# Patient Record
Sex: Female | Born: 1965
Health system: Southern US, Community
[De-identification: ages and names within clinical notes are randomized; demographics above are authoritative.]

## PROBLEM LIST (undated history)

## (undated) DIAGNOSIS — F419 Anxiety disorder, unspecified: Secondary | ICD-10-CM

## (undated) DIAGNOSIS — Z860101 Personal history of adenomatous and serrated colon polyps: Secondary | ICD-10-CM

## (undated) DIAGNOSIS — G4730 Sleep apnea, unspecified: Secondary | ICD-10-CM

## (undated) DIAGNOSIS — F32A Depression, unspecified: Secondary | ICD-10-CM

## (undated) DIAGNOSIS — E890 Postprocedural hypothyroidism: Secondary | ICD-10-CM

## (undated) DIAGNOSIS — Z85850 Personal history of malignant neoplasm of thyroid: Secondary | ICD-10-CM

## (undated) DIAGNOSIS — Z8679 Personal history of other diseases of the circulatory system: Secondary | ICD-10-CM

## (undated) DIAGNOSIS — F329 Major depressive disorder, single episode, unspecified: Secondary | ICD-10-CM

## (undated) DIAGNOSIS — C801 Malignant (primary) neoplasm, unspecified: Secondary | ICD-10-CM

## (undated) DIAGNOSIS — Z8742 Personal history of other diseases of the female genital tract: Secondary | ICD-10-CM

## (undated) DIAGNOSIS — Z8659 Personal history of other mental and behavioral disorders: Secondary | ICD-10-CM

## (undated) DIAGNOSIS — Z86010 Personal history of colonic polyps: Secondary | ICD-10-CM

## (undated) DIAGNOSIS — Z923 Personal history of irradiation: Secondary | ICD-10-CM

## (undated) HISTORY — PX: COLONOSCOPY: SHX174

## (undated) HISTORY — DX: Sleep apnea, unspecified: G47.30

## (undated) HISTORY — DX: Major depressive disorder, single episode, unspecified: F32.9

## (undated) HISTORY — DX: Depression, unspecified: F32.A

## (undated) HISTORY — PX: KNEE ARTHROSCOPY W/ ACL RECONSTRUCTION: SHX1858

## (undated) HISTORY — PX: LAPAROSCOPIC SALPINGOOPHERECTOMY: SUR795

## (undated) HISTORY — PX: POLYPECTOMY: SHX149

## (undated) HISTORY — PX: LIPOMA EXCISION: SHX5283

## (undated) HISTORY — DX: Postprocedural hypothyroidism: E89.0

## (undated) HISTORY — DX: Malignant (primary) neoplasm, unspecified: C80.1

## (undated) HISTORY — PX: OTHER SURGICAL HISTORY: SHX169

---

## 1993-04-26 DIAGNOSIS — E890 Postprocedural hypothyroidism: Secondary | ICD-10-CM

## 1993-04-26 DIAGNOSIS — Z923 Personal history of irradiation: Secondary | ICD-10-CM

## 1993-04-26 DIAGNOSIS — Z9889 Other specified postprocedural states: Secondary | ICD-10-CM

## 1993-04-26 HISTORY — PX: TOTAL THYROIDECTOMY: SHX2547

## 1993-04-26 HISTORY — DX: Postprocedural hypothyroidism: E89.0

## 1993-04-26 HISTORY — DX: Other specified postprocedural states: Z98.890

## 1993-04-26 HISTORY — DX: Personal history of irradiation: Z92.3

## 1999-02-03 ENCOUNTER — Other Ambulatory Visit: Admission: RE | Admit: 1999-02-03 | Discharge: 1999-02-03 | Payer: Self-pay | Admitting: *Deleted

## 2002-04-26 HISTORY — PX: APPENDECTOMY: SHX54

## 2002-09-04 ENCOUNTER — Other Ambulatory Visit: Admission: RE | Admit: 2002-09-04 | Discharge: 2002-09-04 | Payer: Self-pay | Admitting: Obstetrics and Gynecology

## 2004-10-13 ENCOUNTER — Ambulatory Visit (HOSPITAL_COMMUNITY): Admission: RE | Admit: 2004-10-13 | Discharge: 2004-10-13 | Payer: Self-pay | Admitting: General Surgery

## 2004-10-13 ENCOUNTER — Ambulatory Visit (HOSPITAL_BASED_OUTPATIENT_CLINIC_OR_DEPARTMENT_OTHER): Admission: RE | Admit: 2004-10-13 | Discharge: 2004-10-13 | Payer: Self-pay | Admitting: General Surgery

## 2004-10-13 ENCOUNTER — Encounter (INDEPENDENT_AMBULATORY_CARE_PROVIDER_SITE_OTHER): Payer: Self-pay | Admitting: *Deleted

## 2008-09-30 ENCOUNTER — Encounter: Payer: Self-pay | Admitting: Internal Medicine

## 2008-10-30 ENCOUNTER — Encounter: Payer: Self-pay | Admitting: Internal Medicine

## 2008-10-31 ENCOUNTER — Encounter: Payer: Self-pay | Admitting: Internal Medicine

## 2008-10-31 LAB — CONVERTED CEMR LAB
ALT: 17 units/L
BUN: 5 mg/dL
Chloride: 105 meq/L
Cholesterol: 145 mg/dL
HDL: 43 mg/dL
LDL Cholesterol: 85 mg/dL
Potassium: 4.1 meq/L
Sodium: 138 meq/L
Total Protein: 6.8 g/dL

## 2008-11-29 ENCOUNTER — Encounter: Payer: Self-pay | Admitting: Internal Medicine

## 2008-12-13 ENCOUNTER — Encounter: Payer: Self-pay | Admitting: Internal Medicine

## 2008-12-14 ENCOUNTER — Encounter: Payer: Self-pay | Admitting: Internal Medicine

## 2008-12-14 LAB — CONVERTED CEMR LAB: TSH: 1.43 microintl units/mL

## 2009-01-10 ENCOUNTER — Encounter: Payer: Self-pay | Admitting: Internal Medicine

## 2009-02-10 ENCOUNTER — Encounter: Payer: Self-pay | Admitting: Internal Medicine

## 2009-03-12 ENCOUNTER — Encounter: Payer: Self-pay | Admitting: Internal Medicine

## 2009-04-15 ENCOUNTER — Encounter: Payer: Self-pay | Admitting: Internal Medicine

## 2009-05-14 ENCOUNTER — Encounter: Payer: Self-pay | Admitting: Internal Medicine

## 2009-12-15 ENCOUNTER — Ambulatory Visit: Payer: Self-pay | Admitting: Internal Medicine

## 2009-12-15 DIAGNOSIS — F329 Major depressive disorder, single episode, unspecified: Secondary | ICD-10-CM

## 2009-12-15 DIAGNOSIS — R519 Headache, unspecified: Secondary | ICD-10-CM | POA: Insufficient documentation

## 2009-12-15 DIAGNOSIS — R309 Painful micturition, unspecified: Secondary | ICD-10-CM | POA: Insufficient documentation

## 2009-12-15 DIAGNOSIS — R300 Dysuria: Secondary | ICD-10-CM

## 2009-12-15 DIAGNOSIS — Z8679 Personal history of other diseases of the circulatory system: Secondary | ICD-10-CM | POA: Insufficient documentation

## 2009-12-15 DIAGNOSIS — R51 Headache: Secondary | ICD-10-CM | POA: Insufficient documentation

## 2009-12-15 DIAGNOSIS — F32A Depression, unspecified: Secondary | ICD-10-CM | POA: Insufficient documentation

## 2009-12-15 DIAGNOSIS — R32 Unspecified urinary incontinence: Secondary | ICD-10-CM | POA: Insufficient documentation

## 2009-12-15 DIAGNOSIS — E039 Hypothyroidism, unspecified: Secondary | ICD-10-CM | POA: Insufficient documentation

## 2009-12-15 DIAGNOSIS — Z9189 Other specified personal risk factors, not elsewhere classified: Secondary | ICD-10-CM | POA: Insufficient documentation

## 2009-12-15 LAB — CONVERTED CEMR LAB
Nitrite: POSITIVE
Specific Gravity, Urine: 1.015 (ref 1.000–1.030)
TSH: 12.73 microintl units/mL — ABNORMAL HIGH (ref 0.35–5.50)
Total Protein, Urine: 30 mg/dL
Urine Glucose: 100 mg/dL
Urobilinogen, UA: 4 (ref 0.0–1.0)

## 2009-12-16 ENCOUNTER — Encounter: Payer: Self-pay | Admitting: Internal Medicine

## 2009-12-23 ENCOUNTER — Telehealth (INDEPENDENT_AMBULATORY_CARE_PROVIDER_SITE_OTHER): Payer: Self-pay | Admitting: *Deleted

## 2009-12-24 ENCOUNTER — Encounter: Payer: Self-pay | Admitting: Internal Medicine

## 2010-03-23 ENCOUNTER — Ambulatory Visit: Payer: Self-pay | Admitting: Internal Medicine

## 2010-04-21 ENCOUNTER — Ambulatory Visit: Payer: Self-pay | Admitting: Internal Medicine

## 2010-05-28 NOTE — Letter (Signed)
Summary: [facility]   Imported By: Sherian Rein 01/06/2010 11:37:45  _____________________________________________________________________  External Attachment:    Type:   Image     Comment:   External Document

## 2010-05-28 NOTE — Progress Notes (Signed)
  Phone Note Other Incoming   Request: Send information Summary of Call: Records received from Dr. Rosalene Billings. 20 pages forwarded to Dr. Felicity Coyer for review.

## 2010-05-28 NOTE — Letter (Signed)
Summary: [facility]   Imported By: Sherian Rein 01/06/2010 11:39:23  _____________________________________________________________________  External Attachment:    Type:   Image     Comment:   External Document

## 2010-05-28 NOTE — Letter (Signed)
Summary: [facility]   Imported By: Sherian Rein 01/06/2010 11:36:56  _____________________________________________________________________  External Attachment:    Type:   Image     Comment:   External Document

## 2010-05-28 NOTE — Letter (Signed)
Summary: [facility]   Imported By: Sherian Rein 01/06/2010 11:38:28  _____________________________________________________________________  External Attachment:    Type:   Image     Comment:   External Document

## 2010-05-28 NOTE — Letter (Signed)
Summary: [facility]   Imported By: Sherian Rein 01/06/2010 11:34:15  _____________________________________________________________________  External Attachment:    Type:   Image     Comment:   External Document

## 2010-05-28 NOTE — Letter (Signed)
Summary: [facility]   Imported By: Sherian Rein 01/06/2010 11:41:00  _____________________________________________________________________  External Attachment:    Type:   Image     Comment:   External Document

## 2010-05-28 NOTE — Letter (Signed)
Summary: [facility]   Imported By: Sherian Rein 01/06/2010 11:35:58  _____________________________________________________________________  External Attachment:    Type:   Image     Comment:   External Document

## 2010-05-28 NOTE — Letter (Signed)
Summary: [facility]   Imported By: Sherian Rein 01/06/2010 11:40:08  _____________________________________________________________________  External Attachment:    Type:   Image     Comment:   External Document

## 2010-05-28 NOTE — Assessment & Plan Note (Signed)
Summary: NEW [employer] PT--PKG--#---STC   Vital Signs:  Patient profile:   45 year old female Height:      62 inches (157.48 cm) Weight:      217.0 pounds (98.64 kg) BMI:     39.83 O2 Sat:      97 % on Room air Temp:     98.4 degrees F (36.89 degrees C) oral Pulse rate:   75 / minute BP sitting:   118 / 82  (left arm) Cuff size:   large  Vitals Entered By: Orlan Leavens RMA (December 15, 2009 2:06 PM)  O2 Flow:  Room air CC: New patient Is Patient Diabetic? No Pain Assessment Patient in pain? no        Primary Care Provider:  Newt Lukes MD  CC:  New patient.  History of Present Illness: new pt to me and our practice, here to est care  1) hypothyroid, postsurgical s/p total thyroidectomy 1995 due to thyroid cancer - reports compliance with ongoing medical treatment and no changes in medication dose or frequency. denies adverse side effects related to current therapy.  feels she has gained sig weight since2 years ago when synth dose dropped from 150 to 125  2) c/o dysuria - hx same - has 3-4 UTI/yr - onset current symptoms 24-48h ago c/o ur freq and dicomfort, incomplete bladder empty sensation - no hematuria or flanks pain - mild suprapubic discomfort - symptoms improved since starting OTC azo  3) obesity - fustrated with inability to lose weight has tried diet and exercise: Advertising account planner, weight watchers - prev temp success with adipex but resumed weight gain as soon as med stopped- recognizes part of issue is her inability to control her eating binges - "eat until i am sick"  Preventive Screening-Counseling & Management  Alcohol-Tobacco     Alcohol drinks/day: <1     Alcohol Counseling: not indicated; use of alcohol is not excessive or problematic     Smoking Status: never     Tobacco Counseling: not indicated; no tobacco use  Caffeine-Diet-Exercise     Diet Counseling: to improve diet; diet is suboptimal     Does Patient Exercise: no  Exercise Counseling: to improve exercise regimen     Depression Counseling: not indicated; screening negative for depression  Safety-Violence-Falls     Seat Belt Counseling: not indicated; patient wears seat belts     Helmet Counseling: not indicated; patient wears helmet when riding bicycle/motocycle     Firearms in the Home: no firearms in the home     Firearm Counseling: not applicable     Violence Counseling: not indicated; no violence risk noted     Fall Risk Counseling: not indicated; no significant falls noted  Current Medications (verified): 1)  Levothyroxine Sodium 125 Mcg Tabs (Levothyroxine Sodium) .... Take 1 By Mouth Once Daily  Allergies (verified): No Known Drug Allergies  Past History:  Past Medical History: Depression Hypothyroidism, post surgical - hx thyroid cancer s/p resction and XRT -1994 obesity  [place]  Past Surgical History: Appendectomy (2004) Caesarean section (1986) Thyroidectomy, total (1995)   Family History: Family History of Colon CA 1st degree relative <60 (parent) Alzheimers (grandparent,parent,other relative)  Social History: Never Smoked, occassional alcohol use married, lives with spouse  works as Airline pilot Smoking Status:  never Does Patient Exercise:  no  Review of Systems       see HPI above. I have reviewed all other systems  and they were negative.   Physical Exam  General:  overweight-appearing.  alert, well-developed, well-nourished, and cooperative to examination.    Head:  Normocephalic and atraumatic without obvious abnormalities. No apparent alopecia or balding. Eyes:  vision grossly intact; pupils equal, round and reactive to light.  conjunctiva and lids normal.    Ears:  normal pinnae bilaterally, without erythema, swelling, or tenderness to palpation. TMs clear, without effusion, or cerumen impaction. Hearing grossly normal bilaterally  Mouth:  teeth and gums in  good repair; mucous membranes moist, without lesions or ulcers. oropharynx clear without exudate, no erythema.  Neck:  well healed thyroidectomy scar; supple, full ROM, no masses, no JVD or carotid bruits.   Lungs:  normal respiratory effort, no intercostal retractions or use of accessory muscles; normal breath sounds bilaterally - no crackles and no wheezes.    Heart:  normal rate, regular rhythm, no murmur, and no rub. BLE without edema. Abdomen:  obese, soft, non-tender, normal bowel sounds, no distention; no masses and no appreciable hepatomegaly or splenomegaly.   Genitalia:  defer to gyn Msk:  No deformity or scoliosis noted of thoracic or lumbar spine.   Neurologic:  alert & oriented X3 and cranial nerves II-XII symetrically intact.  strength normal in all extremities, sensation intact to light touch, and gait normal. speech fluent without dysarthria or aphasia; follows commands with good comprehension.  Skin:  very dry scaling skin of B feet>fingers/hands -  Psych:  Oriented X3, memory intact for recent and remote, normally interactive, good eye contact, not anxious appearing, not depressed appearing, and not agitated.      Impression & Recommendations:  Problem # 1:  HYPOTHYROIDISM (ICD-244.9) hx thyroid cancer 1994 - s/p thyroidectomy and XRT for same review prior records and check TSH now- consider resumed daily dose given weight and skin issues if TSH not over suppressed at current dose  Her updated medication list for this problem includes:    Levothyroxine Sodium 125 Mcg Tabs (Levothyroxine sodium) .Marland Kitchen... Take 1 by mouth once daily  Orders: TLB-TSH (Thyroid Stimulating Hormone) (84443-TSH)  Problem # 2:  DYSURIA (ICD-788.1) improved with azo - check labs and Ucx now - tx abx if +infx identified - Orders: TLB-Udip w/ Micro (81001-URINE) T-Culture, Urine ([id number])  Encouraged to push clear liquids, get enough rest, and take acetaminophen as needed. To be seen in  10 days if no improvement, sooner if worse.  Problem # 3:  OBESITY (ICD-278.00) discussed prior efforts at weight loss - none sustained - recognizes behavioral component as well (binge eating; lack of self control) checklabs r/o DM and cho issues at future visits - also discussed poss surg options that may or may not be of help to pt's particular situation -  encouraged to attend a bariatric seminar presentation if interested otherwise, consider behav health along with cont exercise efforts  Ht: 62 (12/15/2009)   Wt: 217.0 (12/15/2009)   BMI: 39.83 (12/15/2009)  Problem # 4:  UNSPECIFIED URINARY INCONTINENCE (ICD-788.30) c/o constant mild incontinence of urine - not stress or urge by hx - refer to gyn for pelvic/PAP and urogyn tx as needed -  Orders: Gynecologic Referral (Gyn)  Complete Medication List: 1)  Levothyroxine Sodium 125 Mcg Tabs (Levothyroxine sodium) .... Take 1 by mouth once daily  Patient Instructions: 1)  it was good to see you today. 2)  test(s) ordered today - your results will be posted on the phone tree for review in 48-72 hours from the time of test  completion; call [phone] and enter your 9 digit MRN (listed above on this page, just below your name); if any changes need to be made or there are abnormal results (need for antibioitcs or med dose change), you will be contacted directly. 3)  we'll make referral to gynecology. Our office will contact you regarding this appointment once made.  4)  [facility]  Please schedule a follow-up appointment in 3months, sooner if problems.

## 2010-05-28 NOTE — Letter (Signed)
Summary: [facility]   Imported By: Sherian Rein 01/06/2010 11:35:08  _____________________________________________________________________  External Attachment:    Type:   Image     Comment:   External Document

## 2010-05-28 NOTE — Assessment & Plan Note (Signed)
Summary: 3 MTH FU  STC   Vital Signs:  Patient profile:   45 year old female Height:      62 inches (157.48 cm) Weight:      214.4 pounds (97.45 kg) O2 Sat:      98 % on Room air Temp:     97.9 degrees F (36.61 degrees C) oral Pulse rate:   74 / minute BP sitting:   112 / 72  (right arm) Cuff size:   large  Vitals Entered By: Orlan Leavens RMA (March 23, 2010 1:19 PM)  O2 Flow:  Room air CC: 3 month follow-up Is Patient Diabetic? No Pain Assessment Patient in pain? no        Primary Care Provider:  Newt Lukes MD  CC:  3 month follow-up.  History of Present Illness: here for f/u  hypothyroid, postsurgical s/p total thyroidectomy 1995 due to thyroid cancer - reports compliance with ongoing medical treatment; last change in medication dose or frequency here 11/2009 due to abn TSH. denies adverse side effects related to current therapy.  feels she has gained sig weight since 2 years ago when synth dose dropped from 150 to 125   obesity - fustrated with inability to lose weight has tried diet and exercise: Advertising account planner, weight watchers - prev temp success with adipex but resumed weight gain as soon as med stopped- recognizes part of issue is her inability to control her eating binges - "eat until i am sick"  Clinical Review Panels:  Prevention   Last Pap Smear:  Interpretation Result:Negative for intraepithelial Lesion or Malignancy.    (10/30/2008)  Lipid Management   Cholesterol:  145 (10/31/2008)   LDL (bad choesterol):  85 (10/31/2008)   HDL (good cholesterol):  43 (10/31/2008)   Triglycerides:  76 (10/31/2008)   Current Medications (verified): 1)  Levothyroxine Sodium 150 Mcg Tabs (Levothyroxine Sodium) .Marland Kitchen.. 1 By Mouth Once Daily  Allergies (verified): No Known Drug Allergies  Past History:  Past Medical History: Depression Hypothyroidism, post surgical hx thyroid cancer s/p resction and XRT -1994 obesity  [place]  Review of Systems  The patient denies anorexia, weight gain, chest pain, headaches, and abdominal pain.    Physical Exam  General:  overweight-appearing.  alert, well-developed, well-nourished, and cooperative to examination.    Neck:  well healed thyroidectomy scar; supple, full ROM, no masses, no JVD or carotid bruits.   Lungs:  normal respiratory effort, no intercostal retractions or use of accessory muscles; normal breath sounds bilaterally - no crackles and no wheezes.    Heart:  normal rate, regular rhythm, no murmur, and no rub. BLE without edema.   Impression & Recommendations:  Problem # 1:  HYPOTHYROIDISM (ICD-244.9)  Her updated medication list for this problem includes:    Levothyroxine Sodium 150 Mcg Tabs (Levothyroxine sodium) .Marland Kitchen... 1 by mouth once daily  hx thyroid cancer 1994 - s/p thyroidectomy and XRT for same resumed daily dose 11/2009 due to abnTSH and weight/ skin issues  Orders: TLB-TSH (Thyroid Stimulating Hormone) (84443-TSH)  Labs Reviewed: TSH: 12.73 (12/15/2009)    Chol: 145 (10/31/2008)   HDL: 43 (10/31/2008)   LDL: 85 (10/31/2008)   TG: 76 (10/31/2008)  Problem # 2:  OBESITY (ICD-278.00)  discussed prior efforts at weight loss - none sustained - recognizes behavioral component as well (binge eating; lack of self control) also discussed poss surg options that may or may not be of  help to pt's particular situation -  has attended a bariatric seminar 12/2009 - needs 6 mo MD supervised weight loss program to be approved for surg by insurance will f/u here 30d for same - consider adipex (used same prev) otherwise, consider behav health along with cont exercise efforts at gym as ongoing watch calorie consumption - goal 1200mg /d, reviewed dietary food goals  Ht: 62 (12/15/2009)   Wt: 217.0 (12/15/2009)   BMI: 39.83 (12/15/2009)  Ht: 62 (03/23/2010)   Wt: 214.4 (03/23/2010)   BMI: 39.83  (12/15/2009)  Complete Medication List: 1)  Levothyroxine Sodium 150 Mcg Tabs (Levothyroxine sodium) .Marland Kitchen.. 1 by mouth once daily  Patient Instructions: 1)  it was good to see you today. 2)  test(s) ordered today - your results will be posted on the phone tree for review in 48-72 hours from the time of test completion; call [phone] and enter your 9 digit MRN (listed above on this page, just below your name); if any changes need to be made or there are abnormal results, you will be contacted directly. 3)  Please schedule a follow-up appointment in 1 month to review weight and diet efforts, call sooner if problems.  (may cancel if bariatric clinic has other monitoring for your 34month supervised weight loss requirement) 4)  continue exercsie at gym - 74min/session 3-4x/week - aerobic and add in weight training as able 5)  limit calories to 1200mg /day - brown carbs like whole wheat pastas or brown rice are preferred for better "glycemic index" - look for ratio <20:1 for carb:fiber on label to guide these choices   Orders Added: 1)  TLB-TSH (Thyroid Stimulating Hormone) [84443-TSH] 2)  Est. Patient Level IV [86578]

## 2010-08-31 ENCOUNTER — Encounter: Payer: Self-pay | Admitting: Internal Medicine

## 2010-08-31 ENCOUNTER — Other Ambulatory Visit: Payer: 59

## 2010-08-31 ENCOUNTER — Ambulatory Visit (INDEPENDENT_AMBULATORY_CARE_PROVIDER_SITE_OTHER): Payer: 59 | Admitting: Internal Medicine

## 2010-08-31 VITALS — BP 102/72 | HR 75 | Temp 98.4°F

## 2010-08-31 DIAGNOSIS — N390 Urinary tract infection, site not specified: Secondary | ICD-10-CM

## 2010-08-31 LAB — POCT URINALYSIS DIPSTICK
Nitrite, UA: NEGATIVE
Protein, UA: NEGATIVE
Spec Grav, UA: 1.002
Urobilinogen, UA: 0.2

## 2010-08-31 MED ORDER — SULFAMETHOXAZOLE-TRIMETHOPRIM 800-160 MG PO TABS: 1.0000 | ORAL_TABLET | Freq: Two times a day (BID) | ORAL | Status: AC

## 2010-08-31 MED ORDER — LEVOTHYROXINE SODIUM 125 MCG PO CAPS: 125.0000 ug | ORAL_CAPSULE | ORAL | Status: DC

## 2010-08-31 NOTE — Progress Notes (Signed)
  Subjective:    Patient ID: [name], female    DOB: 01-13-1966, 45 y.o.   MRN: [id number]  HPI   here for UTI symptoms Onset 3-4 weeks ago Hx recurrent UTI dating back to young childhood associated with with urinary incont - prior eval by gyn 2011- no prolapse or other gyn problem identified During abx tx for prior UTI, urinary incont improved temporarily - ?relation +flank discomfort and small volume frequent voiding last 1 week - No fever, no hematuria  Also reviewed chronic medical issues: hypothyroid, postsurgical s/p total thyroidectomy 1995 due to thyroid cancer - reports compliance with ongoing medical treatment; last change in medication dose or frequency here 11/2009 due to abn TSH. denies adverse side effects related to current therapy.  feels she has gained sig weight since 2 years ago when synth dose dropped from 150 to 125  obesity - fustrated with inability to lose weight has tried diet and exercise: Advertising account planner, weight watchers - prev temp success with adipex but resumed weight gain as soon as med stopped- recognizes part of issue is her inability to control her eating binges - "eat until i am sick"  Past Medical History  Diagnosis Date  . Thyroid cancer 1994    resection and xrt  . Hypothyroidism, postsurgical 1994  . Depression      Review of Systems  Respiratory: Negative for cough.   Cardiovascular: Negative for chest pain.  Genitourinary: Positive for dysuria, urgency and frequency. Negative for hematuria, flank pain and difficulty urinating.       Objective:   Physical Exam BP 102/72  Pulse 75  Temp(Src) 98.4 F (36.9 C) (Oral)  SpO2 98% Physical Exam  Constitutional: She is overweight; oriented to person, place, and time. She appears well-developed and well-nourished. No distress.  Cardiovascular: Normal rate, regular rhythm and normal heart sounds.  No murmur heard. Pulmonary/Chest: Effort normal and breath sounds normal.  No respiratory distress. She has no wheezes.  Abdominal: Soft. Bowel sounds are normal. She exhibits no distension. There is suprapubic tenderness. No flank tenderness to palpation Psychiatric: She has a normal mood and affect. Her behavior is normal. Judgment and thought content normal.   Lab Results  Component Value Date   CHOL 145 10/31/2008   HDL 43 10/31/2008   ALT 17 10/31/2008   AST 23 10/31/2008   NA 138 10/31/2008   K 4.1 10/31/2008   CL 105 10/31/2008   CREATININE 0.6 10/31/2008   BUN 5 10/31/2008   CO2 27 10/31/2008   TSH 0.17* 03/23/2010        Assessment & Plan:  UTI, recurrent with child hx same - ?urinay reflux per pt recollection - never with adult uro eval Udip unremarkable but classic symptoms - will send for Ucx and tx with emperic septra (recent tx with cipro at [facility] for same) Also refer to uro

## 2010-08-31 NOTE — Patient Instructions (Signed)
It was good to see you today. Septra antibiotics for bladder symptoms like UTI - Your prescription(s) have been submitted to your pharmacy. Please take as directed and contact our office if you believe you are having problem(s) with the medication(s). we'll make referral to urology given recurrent history of frequent bladder infections since childhood. Our office will contact you regarding appointment(s) once made.

## 2010-09-03 LAB — URINE CULTURE

## 2010-09-04 ENCOUNTER — Telehealth: Payer: Self-pay | Admitting: *Deleted

## 2010-09-04 MED ORDER — NITROFURANTOIN MONOHYD MACRO 100 MG PO CAPS: 100.0000 mg | ORAL_CAPSULE | Freq: Two times a day (BID) | ORAL | Status: DC

## 2010-09-04 NOTE — Telephone Encounter (Signed)
Sent to [facility] in [place].Marland KitchenMarland Kitchen5/11/12@10 :35am/LMB

## 2010-09-11 NOTE — Op Note (Signed)
NAMEJoelie, [name]              ACCOUNT NO.:  [id number]   MEDICAL RECORD NO.:  [id number]          PATIENT TYPE:  AMB   LOCATION:  [facility]                         FACILITY:  [facility]   PHYSICIAN:  Sharlet Salina T. [place], M.D.DATE OF BIRTH:  12/05/1965   DATE OF PROCEDURE:  10/13/2004  DATE OF DISCHARGE:                                 OPERATIVE REPORT   PREOPERATIVE DIAGNOSIS:  Subcutaneous mass, right neck, probable lipoma.   POSTOPERATIVE DIAGNOSIS:  Subcutaneous mass right neck, probable lipoma.   SURGICAL PROCEDURES:  Excision subcutaneous mass right neck.   SURGEON:  Dr. Johna Sheriff   ANESTHESIA:  Local IV sedation.   BRIEF HISTORY:  [name] is a 45 year old female, who has a history of  about 13 years ago of excision of a lipoma from her right posterior neck.  She recently has noted a recurrent mass adjacent to the incision and on  examination, has approximately a 3-4 cm fleshy subcutaneous mass palpable  just lateral to the old excision site, consistent with a recurrent lipoma.  Since this is an enlarging and symptomatic, we have recommended to  proceeding  with excision.  The nature of the procedure, indications, risks  of bleeding, infection, nerve injury discussed and understood.  She is now  brought to the operating room for this procedure.   DESCRIPTION OF OPERATION:  The patient brought to the operating room, placed  in the left lateral decubitus position, and IV sedation was administered.  The right posterior neck was sterilely prepped and draped.  I used the  lateral aspect of the previous transverse incision and extended this  somewhat more laterally, and dissection was carried down through the  subcutaneous tissue.  A very discrete, well-circumscribed, fatty mass was  encountered in the deep subcutaneous tissue, and this was completely  dissected free intact from surrounding tissue and from the deeper tissue.  It did not extend down to the level of the fascia or  muscle.  It was removed  intact.  Hemostasis was obtained with cautery.  The skin was closed with  running subcuticular 4-0 Monocryl and Steri-Strips.  Sponge, needle, and  instrument counts were correct.  Dry sterile dressing was applied and  patient taken recovery in good condition.       BTH/MEDQ  D:  10/13/2004  T:  10/13/2004  Job:  [id number]

## 2010-11-27 ENCOUNTER — Encounter: Payer: Self-pay | Admitting: Internal Medicine

## 2010-12-02 ENCOUNTER — Encounter: Payer: Self-pay | Admitting: Internal Medicine

## 2010-12-02 ENCOUNTER — Other Ambulatory Visit (INDEPENDENT_AMBULATORY_CARE_PROVIDER_SITE_OTHER): Payer: 59

## 2010-12-02 ENCOUNTER — Ambulatory Visit (INDEPENDENT_AMBULATORY_CARE_PROVIDER_SITE_OTHER): Payer: 59 | Admitting: Internal Medicine

## 2010-12-02 VITALS — BP 104/72 | HR 65 | Temp 97.1°F | Ht 62.0 in | Wt 219.0 lb

## 2010-12-02 DIAGNOSIS — R197 Diarrhea, unspecified: Secondary | ICD-10-CM

## 2010-12-02 DIAGNOSIS — Z0000 Encounter for general adult medical examination without abnormal findings: Secondary | ICD-10-CM

## 2010-12-02 DIAGNOSIS — Z23 Encounter for immunization: Secondary | ICD-10-CM

## 2010-12-02 DIAGNOSIS — E039 Hypothyroidism, unspecified: Secondary | ICD-10-CM

## 2010-12-02 DIAGNOSIS — Z1211 Encounter for screening for malignant neoplasm of colon: Secondary | ICD-10-CM

## 2010-12-02 LAB — URINALYSIS
Bilirubin Urine: NEGATIVE
Hgb urine dipstick: NEGATIVE
Ketones, ur: 40
Leukocytes, UA: NEGATIVE
Nitrite: NEGATIVE

## 2010-12-02 LAB — BASIC METABOLIC PANEL
Chloride: 106 mEq/L (ref 96–112)
Potassium: 3.9 mEq/L (ref 3.5–5.1)

## 2010-12-02 LAB — HEPATIC FUNCTION PANEL
ALT: 19 U/L (ref 0–35)
AST: 18 U/L (ref 0–37)
Alkaline Phosphatase: 69 U/L (ref 39–117)
Bilirubin, Direct: 0.1 mg/dL (ref 0.0–0.3)
Total Bilirubin: 0.7 mg/dL (ref 0.3–1.2)

## 2010-12-02 LAB — TSH: TSH: 0.55 u[IU]/mL (ref 0.35–5.50)

## 2010-12-02 LAB — CBC WITH DIFFERENTIAL/PLATELET
Basophils Absolute: 0 10*3/uL (ref 0.0–0.1)
Eosinophils Absolute: 0.2 10*3/uL (ref 0.0–0.7)
Lymphocytes Relative: 22.4 % (ref 12.0–46.0)
MCHC: 33.5 g/dL (ref 30.0–36.0)
Neutrophils Relative %: 69.1 % (ref 43.0–77.0)
RBC: 4.48 Mil/uL (ref 3.87–5.11)
RDW: 14.5 % (ref 11.5–14.6)

## 2010-12-02 LAB — LIPID PANEL
HDL: 55.7 mg/dL (ref >39.00)
LDL Cholesterol: 86 mg/dL (ref 0–99)
Total CHOL/HDL Ratio: 3
VLDL: 16.8 mg/dL (ref 0.0–40.0)

## 2010-12-02 MED ORDER — VALACYCLOVIR HCL 1 G PO TABS: 1000.0000 mg | ORAL_TABLET | Freq: Two times a day (BID) | ORAL | Status: DC

## 2010-12-02 NOTE — Progress Notes (Signed)
Subjective:    Patient ID: [name], female    DOB: 1966/02/01, 45 y.o.   MRN: [id number]  HPI  patient is here today for annual physical. Patient feels well over -  complains of diarrhea x 3 months - daily "loose or liquid" stools BM 4-5x/day, small volume Not abd cramping or pain, no BRBPR or melena No weight loss, no fever, no nausea and vomiting  No travel, cruise, undercooked food or new meds - but ?if thyroid is off  also reviewed chronic medical issues:  hypothyroid, postsurgical s/p total thyroidectomy 1994 due to thyroid cancer - reports compliance with ongoing medical treatment; last change in medication dose or frequency 11/2009 due to abn TSH. denies adverse side effects related to current therapy.  feels she has gained sig weight since 2010 when synth dose dropped from 150 to 125  Depression - takes Lithium and prozac for same - mood stable, no SI/HI   Past Medical History  Diagnosis Date  . OBESITY   . EATING DISORDER, HX OF   . Thyroid cancer 1994    resection and xrt  . Hypothyroidism, postsurgical 1994  . Depression    Family History  Problem Relation Age of Onset  . Colon cancer Other   . Alzheimer's disease Other   . Alzheimer's disease Mother   . Cancer Father 21    colo cancer  . Alzheimer's disease Maternal Grandmother    History  Substance Use Topics  . Smoking status: Never Smoker   . Smokeless tobacco: Not on file   Comment: Married, lives with spouse. Works as Airline pilot  . Alcohol Use: Yes     Occassional   Review of Systems  Respiratory: Negative for cough.   Cardiovascular: Negative for chest pain.  Genitourinary: Positive for dysuria, urgency and frequency. Negative for hematuria, flank pain and difficulty urinating.  Constitutional: Negative for fever.  Gastrointestinal: Negative for abdominal pain.  Musculoskeletal: Negative for gait problem.  Skin: Negative for rash.  Neurological: Negative for dizziness.  No other  specific complaints in a complete review of systems (except as listed in HPI above).      Objective:   Physical Exam  BP 104/72  Pulse 65  Temp(Src) 97.1 F (36.2 C) (Oral)  Ht 5\' 2"  (1.575 m)  Wt 219 lb (99.338 kg)  BMI 40.06 kg/m2  SpO2 97%  LMP 11/25/2010 Wt Readings from Last 3 Encounters:  12/02/10 219 lb (99.338 kg)  03/23/10 214 lb 6.4 oz (97.251 kg)  12/15/09 217 lb (98.431 kg)   Constitutional: She is overweight; She appears well-developed and well-nourished. No distress. spouse at side HENT: NCAT, Ears with clear TMs bilaterally, no effusion; OP clear without erythema or exudate Eyes: PERRL, EOMI - no conjunctivitis or icterus Cardiovascular: Normal rate, regular rhythm and normal heart sounds.  No murmur heard. no BLE edema Pulmonary/Chest: Effort normal and breath sounds normal. No respiratory distress. She has no wheezes.  Abdominal: Soft. Bowel sounds are normal. no tenderness. No distention, no mass Neuro: AAOx4, CN2-12 symmetrically intact; MAE well, coordination and balance normal; speech and cognition normal Psychiatric: She has a normal mood and affect. Her behavior is normal. Judgment and thought content normal.  Skin, healing cold sore under L nares  Lab Results  Component Value Date   WBC 12.6* 12/02/2010   HGB 13.7 12/02/2010   HCT 40.8 12/02/2010   PLT 291.0 12/02/2010   CHOL 158 12/02/2010   TRIG 84.0 12/02/2010   HDL 55.70 12/02/2010  ALT 19 12/02/2010   AST 18 12/02/2010   NA 139 12/02/2010   K 3.9 12/02/2010   CL 106 12/02/2010   CREATININE 0.8 12/02/2010   BUN 14 12/02/2010   CO2 28 12/02/2010   TSH 0.55 12/02/2010        Assessment & Plan:  CPX - v70.0 - Patient has been counseled on age-appropriate routine health concerns for screening and prevention. These are reviewed and up-to-date. Immunizations are up-to-date or declined. Labs ordered and ECG declined. Colo screen refer now due to FH (dad colo ca age 27)  Diarrhea - no red flags on hx or exam - check labs  rule out thyroid problems - ?malabs - no change meds at this time

## 2010-12-02 NOTE — Patient Instructions (Signed)
It was good to see you today. We have reviewed your prior records including labs and tests today Medications reviewed, refill on Valtrex as requested Test(s) ordered today. Your results will be called to you after review (48-72hours after test completion). If any changes need to be made, you will be notified at that time. we'll make referral to GI for screening colonoscopy. Our office will contact you regarding appointment(s) once made. Please schedule followup in 3-6 months for thyroid check, call sooner if problems.

## 2010-12-02 NOTE — Assessment & Plan Note (Signed)
S/p totall thyroidectomy 1995 due to cancer Dose change 11/2009 due to abn TSH - Check tsh now - adjust as needed

## 2010-12-08 ENCOUNTER — Encounter: Payer: Self-pay | Admitting: Internal Medicine

## 2010-12-31 ENCOUNTER — Telehealth: Payer: Self-pay | Admitting: *Deleted

## 2010-12-31 ENCOUNTER — Ambulatory Visit (AMBULATORY_SURGERY_CENTER): Payer: 59 | Admitting: *Deleted

## 2010-12-31 VITALS — Ht 62.0 in | Wt 226.7 lb

## 2010-12-31 DIAGNOSIS — Z1211 Encounter for screening for malignant neoplasm of colon: Secondary | ICD-10-CM

## 2010-12-31 MED ORDER — SUPREP BOWEL PREP KIT 17.5-3.13-1.6 GM/177ML PO SOLN: 1.0000 | Freq: Once | ORAL | Status: DC

## 2010-12-31 NOTE — Telephone Encounter (Signed)
Dr. Juanda Chance- pt is 45 years old; scheduled for screening colonoscopy.  Father diagnosed with colon cancer at age 14.  While in OV pt says that she has been having watery diarrhea since April.  No blood and denies abdominal pain. No treatment for diarrhea.  Is she okay for direct screening colonoscopy or do you want to see her in office first? Thanks, Olegario Messier

## 2011-01-01 ENCOUNTER — Other Ambulatory Visit: Payer: Self-pay | Admitting: Internal Medicine

## 2011-01-01 NOTE — Telephone Encounter (Signed)
She can be a direct colon. I will talk to her before the procedure.

## 2011-01-14 ENCOUNTER — Ambulatory Visit (AMBULATORY_SURGERY_CENTER): Payer: 59 | Admitting: Internal Medicine

## 2011-01-14 ENCOUNTER — Encounter: Payer: Self-pay | Admitting: Internal Medicine

## 2011-01-14 VITALS — BP 107/64 | HR 66 | Temp 98.3°F | Resp 13 | Ht 62.4 in | Wt 226.0 lb

## 2011-01-14 DIAGNOSIS — D126 Benign neoplasm of colon, unspecified: Secondary | ICD-10-CM

## 2011-01-14 DIAGNOSIS — Z1211 Encounter for screening for malignant neoplasm of colon: Secondary | ICD-10-CM

## 2011-01-14 MED ORDER — SODIUM CHLORIDE 0.9 % IV SOLN: 500.0000 mL | INTRAVENOUS | Status: DC

## 2011-01-14 NOTE — Patient Instructions (Signed)
Read your handouts given to you by your recovery room nurse.    Your polyp results will be mailed to you within 2 weeks.   Resume your routine medications today.    If you have any questions or concerns, please call [phone].   Thank-you for choosing [facility] for your medical needs today.

## 2011-01-15 ENCOUNTER — Telehealth: Payer: Self-pay | Admitting: *Deleted

## 2011-01-15 NOTE — Telephone Encounter (Signed)
No ID on voice mail.   No message left. 

## 2011-01-19 ENCOUNTER — Encounter: Payer: Self-pay | Admitting: Internal Medicine

## 2011-06-07 ENCOUNTER — Other Ambulatory Visit: Payer: Self-pay | Admitting: Internal Medicine

## 2011-06-07 ENCOUNTER — Encounter: Payer: Self-pay | Admitting: Internal Medicine

## 2011-06-07 ENCOUNTER — Ambulatory Visit (INDEPENDENT_AMBULATORY_CARE_PROVIDER_SITE_OTHER): Payer: 59 | Admitting: Internal Medicine

## 2011-06-07 ENCOUNTER — Other Ambulatory Visit (INDEPENDENT_AMBULATORY_CARE_PROVIDER_SITE_OTHER): Payer: 59

## 2011-06-07 VITALS — BP 100/64 | HR 77 | Temp 97.6°F | Wt 236.8 lb

## 2011-06-07 DIAGNOSIS — F329 Major depressive disorder, single episode, unspecified: Secondary | ICD-10-CM

## 2011-06-07 DIAGNOSIS — F3289 Other specified depressive episodes: Secondary | ICD-10-CM

## 2011-06-07 DIAGNOSIS — E669 Obesity, unspecified: Secondary | ICD-10-CM

## 2011-06-07 DIAGNOSIS — E039 Hypothyroidism, unspecified: Secondary | ICD-10-CM

## 2011-06-07 MED ORDER — LEVOTHYROXINE SODIUM 150 MCG PO TABS: 150.0000 ug | ORAL_TABLET | Freq: Every day | ORAL | Status: DC

## 2011-06-07 MED ORDER — VENLAFAXINE HCL ER 37.5 MG PO CP24: 37.5000 mg | ORAL_CAPSULE | Freq: Every day | ORAL | Status: DC

## 2011-06-07 NOTE — Assessment & Plan Note (Signed)
S/p totall thyroidectomy 1995 due to cancer Dose change 11/2009 due to abn TSH - Check tsh now - adjust as needed Lab Results  Component Value Date   TSH 0.55 12/02/2010

## 2011-06-07 NOTE — Assessment & Plan Note (Signed)
Intolerant of prozac, Paxil, welllbutrin and Lithium - also prior cymbalta Start effexor now - we reviewed potential risk/benefit and possible side effects - pt understands and agrees to same

## 2011-06-07 NOTE — Progress Notes (Signed)
  Subjective:    Patient ID: [name], female    DOB: 1966-03-08, 46 y.o.   MRN: [id number]  HPI  Here for follow up - reviewed chronic medical issues:  hypothyroid, postsurgical s/p total thyroidectomy 1994 due to thyroid cancer - reports compliance with ongoing medical treatment; last change in medication dose or frequency 11/2009 due to abn TSH. denies adverse side effects related to current therapy.  feels she has gained sig weight since 2010   Depression - previously prescribed Lithium and prozac for same, but not taking due to ?side effects  - mood is down but no SI/HI - prev on wellbutrin, cymbalta, paxil  Obesity - reports "no energy" for exercise - requests a diet supplement prescription - reports has investigated bariatric surgical options and  Has been denied by her insurance for same   Past Medical History  Diagnosis Date  . OBESITY   . EATING DISORDER, HX OF   . Hypothyroidism, postsurgical 1994  . Depression   . Bipolar disorder   . Thyroid cancer 1995    resection and xrt    Review of Systems  Respiratory: Negative for cough.   Cardiovascular: Negative for chest pain.  Genitourinary: Negative for dysuria, hematuria, flank pain and difficulty urinating.  Psychiatric/Behavioral: Positive for dysphoric mood. Negative for confusion and decreased concentration.      Objective:   Physical Exam  BP 100/64  Pulse 77  Temp(Src) 97.6 F (36.4 C) (Oral)  Wt 236 lb 12.8 oz (107.412 kg)  SpO2 97% Wt Readings from Last 3 Encounters:  06/07/11 236 lb 12.8 oz (107.412 kg)  01/14/11 226 lb (102.513 kg)  12/31/10 226 lb 11.2 oz (102.83 kg)   Constitutional: She is overweight; but appears well-developed and well-nourished. No distress. Cardiovascular: Normal rate, regular rhythm and normal heart sounds.  No murmur heard. no BLE edema Pulmonary/Chest: Effort normal and breath sounds normal. No respiratory distress. She has no wheezes.  Psychiatric: She has a  dysthmic mood and affect. Her behavior is normal. Judgment and thought content normal.    Lab Results  Component Value Date   WBC 12.6* 12/02/2010   HGB 13.7 12/02/2010   HCT 40.8 12/02/2010   PLT 291.0 12/02/2010   CHOL 158 12/02/2010   TRIG 84.0 12/02/2010   HDL 55.70 12/02/2010   ALT 19 12/02/2010   AST 18 12/02/2010   NA 139 12/02/2010   K 3.9 12/02/2010   CL 106 12/02/2010   CREATININE 0.8 12/02/2010   BUN 14 12/02/2010   CO2 28 12/02/2010   TSH 0.55 12/02/2010       Assessment & Plan:   See problem list. Medications and labs reviewed today.

## 2011-06-07 NOTE — Assessment & Plan Note (Signed)
Wt Readings from Last 3 Encounters:  06/07/11 236 lb 12.8 oz (107.412 kg)  01/14/11 226 lb (102.513 kg)  12/31/10 226 lb 11.2 oz (102.83 kg)   Could consider appetite suppressant prescription but I feel it is important to control depression symptoms first Also encouraged patient to consider review of bariatric options

## 2011-06-07 NOTE — Patient Instructions (Signed)
It was good to see you today. Test(s) ordered today. Your results will be called to you after review (48-72hours after test completion). If any changes need to be made, you will be notified at that time. Start effexor (generic) low dose today for depression - Your prescription(s) have been submitted to your pharmacy. Please take as directed and contact our office if you believe you are having problem(s) with the medication(s). Please schedule followup in 6-12 weeks to review depression symptoms, medications and weight - call sooner if problems.

## 2011-07-16 ENCOUNTER — Ambulatory Visit (INDEPENDENT_AMBULATORY_CARE_PROVIDER_SITE_OTHER): Payer: 59 | Admitting: Internal Medicine

## 2011-07-16 ENCOUNTER — Encounter: Payer: Self-pay | Admitting: Internal Medicine

## 2011-07-16 ENCOUNTER — Other Ambulatory Visit (INDEPENDENT_AMBULATORY_CARE_PROVIDER_SITE_OTHER): Payer: 59

## 2011-07-16 VITALS — BP 116/82 | HR 88 | Temp 97.8°F | Resp 18 | Wt 235.0 lb

## 2011-07-16 DIAGNOSIS — E669 Obesity, unspecified: Secondary | ICD-10-CM

## 2011-07-16 DIAGNOSIS — E039 Hypothyroidism, unspecified: Secondary | ICD-10-CM

## 2011-07-16 DIAGNOSIS — F3289 Other specified depressive episodes: Secondary | ICD-10-CM

## 2011-07-16 DIAGNOSIS — F329 Major depressive disorder, single episode, unspecified: Secondary | ICD-10-CM

## 2011-07-16 MED ORDER — VENLAFAXINE HCL ER 75 MG PO CP24: 75.0000 mg | ORAL_CAPSULE | Freq: Every day | ORAL | Status: DC

## 2011-07-16 NOTE — Patient Instructions (Signed)
It was good to see you today. Test(s) ordered today. Your results will be called to you after review (48-72hours after test completion). If any changes need to be made, you will be notified at that time. increase effexor (generic) today for depression - Your prescription(s) have been submitted to your pharmacy. Please take as directed and contact our office if you believe you are having problem(s) with the medication(s). Please schedule followup in 6-12 weeks to review depression symptoms, medications and weight - call sooner if problems.

## 2011-07-16 NOTE — Assessment & Plan Note (Signed)
S/p total thyroidectomy 1995 due to cancer Dose change 05/2011 due to abn TSH - Check tsh now - adjust as needed Lab Results  Component Value Date   TSH 43.18* 06/07/2011

## 2011-07-16 NOTE — Assessment & Plan Note (Signed)
Wt Readings from Last 3 Encounters:  07/16/11 235 lb (106.595 kg)  06/07/11 236 lb 12.8 oz (107.412 kg)  01/14/11 226 lb (102.513 kg)   Could consider appetite suppressant prescription but I feel it is important to control depression and thyroid symptoms first Also encouraged patient to consider review of bariatric options

## 2011-07-16 NOTE — Progress Notes (Signed)
  Subjective:    Patient ID: [name], female    DOB: 1965-09-06, 46 y.o.   MRN: [id number]  HPI  Here for follow up - reviewed chronic medical issues:  hypothyroid, postsurgical -s/p total thyroidectomy 1994 due to thyroid cancer - reports compliance with ongoing medical treatment; last change in medication dose or frequency 05/2011 due to abn TSH. denies adverse side effects related to current therapy. feels she has gained sig weight since 2010   Depression - previously prescribed wellbutrin, cymbalta, paxil > ineffective; also prior rx for Lithium and prozac, but not taking due to ?side effects  - started generic effexor 05/2011 - mood is still down but no SI/HI -   Obesity - reports "no energy" for exercise - reports has investigated bariatric surgical options but has been denied by her insurance for same   Past Medical History  Diagnosis Date  . OBESITY   . EATING DISORDER, HX OF   . Hypothyroidism, postsurgical 1994  . Depression   . Bipolar disorder   . Thyroid cancer 1995    resection and xrt    Review of Systems  Respiratory: Negative for cough.   Cardiovascular: Negative for chest pain.  Genitourinary: Negative for dysuria, hematuria, flank pain and difficulty urinating.  Psychiatric/Behavioral: Positive for dysphoric mood. Negative for confusion and decreased concentration.      Objective:   Physical Exam  BP 116/82  Pulse 88  Temp(Src) 97.8 F (36.6 C) (Oral)  Resp 18  Wt 235 lb (106.595 kg)  SpO2 96%  LMP 07/02/2011 Wt Readings from Last 3 Encounters:  07/16/11 235 lb (106.595 kg)  06/07/11 236 lb 12.8 oz (107.412 kg)  01/14/11 226 lb (102.513 kg)   Constitutional: She is overweight; but appears well-developed and well-nourished. No distress. Cardiovascular: Normal rate, regular rhythm and normal heart sounds.  No murmur heard. no BLE edema Pulmonary/Chest: Effort normal and breath sounds normal. No respiratory distress. She has no wheezes.    Psychiatric: She has a dysthmic mood and affect. Her behavior is normal. Judgment and thought content normal.    Lab Results  Component Value Date   WBC 12.6* 12/02/2010   HGB 13.7 12/02/2010   HCT 40.8 12/02/2010   PLT 291.0 12/02/2010   CHOL 158 12/02/2010   TRIG 84.0 12/02/2010   HDL 55.70 12/02/2010   ALT 19 12/02/2010   AST 18 12/02/2010   NA 139 12/02/2010   K 3.9 12/02/2010   CL 106 12/02/2010   CREATININE 0.8 12/02/2010   BUN 14 12/02/2010   CO2 28 12/02/2010   TSH 43.18* 06/07/2011       Assessment & Plan:   See problem list. Medications and labs reviewed today.

## 2011-07-16 NOTE — Assessment & Plan Note (Signed)
Intolerant of prozac, Paxil, welllbutrin and Lithium - also prior cymbalta Started effexor 05/2011, tolerating well Titrate up dose now-

## 2011-08-25 ENCOUNTER — Ambulatory Visit: Payer: 59 | Admitting: Internal Medicine

## 2012-03-15 ENCOUNTER — Ambulatory Visit (INDEPENDENT_AMBULATORY_CARE_PROVIDER_SITE_OTHER): Payer: 59 | Admitting: Internal Medicine

## 2012-03-15 ENCOUNTER — Other Ambulatory Visit (INDEPENDENT_AMBULATORY_CARE_PROVIDER_SITE_OTHER): Payer: 59

## 2012-03-15 ENCOUNTER — Encounter: Payer: Self-pay | Admitting: Internal Medicine

## 2012-03-15 VITALS — BP 116/74 | HR 86 | Temp 99.2°F | Ht 62.0 in | Wt 226.0 lb

## 2012-03-15 DIAGNOSIS — E039 Hypothyroidism, unspecified: Secondary | ICD-10-CM

## 2012-03-15 DIAGNOSIS — F329 Major depressive disorder, single episode, unspecified: Secondary | ICD-10-CM

## 2012-03-15 DIAGNOSIS — F3289 Other specified depressive episodes: Secondary | ICD-10-CM

## 2012-03-15 DIAGNOSIS — E669 Obesity, unspecified: Secondary | ICD-10-CM

## 2012-03-15 LAB — TSH: TSH: 7.55 u[IU]/mL — ABNORMAL HIGH (ref 0.35–5.50)

## 2012-03-15 MED ORDER — PHENTERMINE HCL 37.5 MG PO CAPS: 37.5000 mg | ORAL_CAPSULE | ORAL | Status: DC

## 2012-03-15 NOTE — Progress Notes (Signed)
  Subjective:    Patient ID: [name], female    DOB: 12-04-1965, 46 y.o.   MRN: [id number]  HPI  Here for follow up - reviewed chronic medical issues:  hypothyroid, postsurgical -s/p total thyroidectomy 1994 due to thyroid cancer - reports compliance with ongoing medical treatment; last change in medication dose or frequency 05/2011 due to abn TSH. denies adverse side effects related to current therapy. feels she has gained sig weight since 2010   Depression - on effexor generic 05/2011-11/2011, stopped due to out of refills, does not wish to resume anything at this time - previously prescribed wellbutrin, cymbalta, paxil > ineffective; also prior rx for Lithium and prozac, but not taking due to ?side effects; mood is dysphoric, but no SI/HI -   Obesity - reports "no energy" for exercise - reports has investigated bariatric surgical options but has been denied by her insurance for same. Requests phentermine    Past Medical History  Diagnosis Date  . OBESITY   . EATING DISORDER, HX OF   . Hypothyroidism, postsurgical 1994  . Depression   . Bipolar disorder   . Thyroid cancer 1995    resection and xrt    Review of Systems  Respiratory: Negative for cough.   Cardiovascular: Negative for chest pain.  Genitourinary: Negative for dysuria, hematuria, flank pain and difficulty urinating.  Psychiatric/Behavioral: Positive for dysphoric mood. Negative for confusion and decreased concentration.      Objective:   Physical Exam  BP 116/74  Pulse 86  Temp 99.2 F (37.3 C) (Oral)  Ht 5\' 2"  (1.575 m)  Wt 226 lb (102.513 kg)  BMI 41.34 kg/m2  SpO2 95% Wt Readings from Last 3 Encounters:  03/15/12 226 lb (102.513 kg)  07/16/11 235 lb (106.595 kg)  06/07/11 236 lb 12.8 oz (107.412 kg)   Constitutional: She is overweight; but appears well-developed and well-nourished. No distress. Cardiovascular: Normal rate, regular rhythm and normal heart sounds.  No murmur heard. no BLE  edema Pulmonary/Chest: Effort normal and breath sounds normal. No respiratory distress. She has no wheezes.  Psychiatric: She has a dysthmic mood and affect. Her behavior is normal. Judgment and thought content normal.    Lab Results  Component Value Date   WBC 12.6* 12/02/2010   HGB 13.7 12/02/2010   HCT 40.8 12/02/2010   PLT 291.0 12/02/2010   CHOL 158 12/02/2010   TRIG 84.0 12/02/2010   HDL 55.70 12/02/2010   ALT 19 12/02/2010   AST 18 12/02/2010   NA 139 12/02/2010   K 3.9 12/02/2010   CL 106 12/02/2010   CREATININE 0.8 12/02/2010   BUN 14 12/02/2010   CO2 28 12/02/2010   TSH 3.03 07/16/2011       Assessment & Plan:   See problem list. Medications and labs reviewed today.

## 2012-03-15 NOTE — Assessment & Plan Note (Signed)
S/p total thyroidectomy 1995 due to cancer Dose change 05/2011 due to abn TSH - Check tsh now - adjust as needed Lab Results  Component Value Date   TSH 3.03 07/16/2011

## 2012-03-15 NOTE — Assessment & Plan Note (Signed)
Intolerant of prozac, Paxil, welllbutrin and Lithium - also prior cymbalta Started effexor 05/2011, and titrated up dose 06/2011 but spontaneously stopped 12/2011 when out of refills Does not wish to resume at this time - reports mood stable- Will watch, pt agrees to call if symptoms worse

## 2012-03-15 NOTE — Assessment & Plan Note (Signed)
Wt Readings from Last 3 Encounters:  03/15/12 226 lb (102.513 kg)  07/16/11 235 lb (106.595 kg)  06/07/11 236 lb 12.8 oz (107.412 kg)   The patient is asked to make a resumed attempt to improve diet and exercise patterns to aid in medical management of this problem.  Will use Phentermine to help reach your weight loss goals - today prescription for 1st of 3-6 months provided -  we reviewed potential risk/benefit and possible side effects - pt understands and agrees to same   return in 1 month (nurse visit for weight check) before refill will be given

## 2012-03-15 NOTE — Patient Instructions (Signed)
It was good to see you today. Test(s) ordered today. Your results will be released to MyChart (or called to you) after review, usually within 72hours after test completion. If any changes need to be made, you will be notified at that same time. Will use Phentermine to help you reach your weight loss goals - today prescription for 1st of 3 months provided - If side effects or other problems, please stop medication and call us. Please return in 1 month (nurse visit for weight check) before refill will be given Please schedule followup in 3 months for visit with me to recheck weight and review, call sooner if problems.

## 2012-04-13 ENCOUNTER — Ambulatory Visit (INDEPENDENT_AMBULATORY_CARE_PROVIDER_SITE_OTHER): Payer: 59 | Admitting: *Deleted

## 2012-04-13 VITALS — Wt 218.5 lb

## 2012-04-13 DIAGNOSIS — E669 Obesity, unspecified: Secondary | ICD-10-CM

## 2012-04-13 MED ORDER — PHENTERMINE HCL 37.5 MG PO CAPS: 37.5000 mg | ORAL_CAPSULE | ORAL | Status: DC

## 2012-04-13 NOTE — Progress Notes (Signed)
Pt here for weight check. Dr. Felicity Coyer is out of office today. Nicki Reaper, NP will sign Rx for pt.

## 2012-05-17 ENCOUNTER — Ambulatory Visit: Payer: 59 | Admitting: *Deleted

## 2012-05-17 ENCOUNTER — Telehealth: Payer: Self-pay | Admitting: *Deleted

## 2012-05-17 VITALS — Wt 215.0 lb

## 2012-05-17 DIAGNOSIS — E669 Obesity, unspecified: Secondary | ICD-10-CM

## 2012-05-17 MED ORDER — PHENTERMINE HCL 37.5 MG PO CAPS: 37.5000 mg | ORAL_CAPSULE | ORAL | Status: DC

## 2012-05-17 NOTE — Telephone Encounter (Signed)
PATIENT WEIGHT CHECK TODAY WITH NURSE VISIT. WEIGHT WITH OUR SHOES ON 215#. REQUEST REFILL ON MEDICATION. PHENTERMINE.

## 2012-06-05 ENCOUNTER — Other Ambulatory Visit: Payer: Self-pay | Admitting: *Deleted

## 2012-06-05 MED ORDER — LEVOTHYROXINE SODIUM 150 MCG PO TABS: 150.0000 ug | ORAL_TABLET | Freq: Every day | ORAL | Status: DC

## 2012-06-06 ENCOUNTER — Other Ambulatory Visit: Payer: Self-pay | Admitting: Internal Medicine

## 2012-06-16 ENCOUNTER — Encounter: Payer: Self-pay | Admitting: *Deleted

## 2012-06-19 ENCOUNTER — Ambulatory Visit: Payer: 59 | Admitting: Internal Medicine

## 2012-06-20 NOTE — Progress Notes (Signed)
Patient ID: [name], female   DOB: 26-Jan-1966, 47 y.o.   MRN: [id number] Patient here for weight check only as nurse visit  And to  Have medication refill for Phenternime. Weight 215# Dr. Felicity Coyer informed and Rx for Phentermine printed and signed by Dr. Felicity Coyer given to patient, Instructed patient to return in one month for weight check.

## 2012-06-27 ENCOUNTER — Telehealth: Payer: Self-pay | Admitting: Internal Medicine

## 2012-06-27 NOTE — Telephone Encounter (Signed)
Patient Information:  Caller Name: Gwendolynn  Phone: [phone]  Patient: [name], [name]  Gender: Female  DOB: Oct 12, 1965  Age: 47 Years  PCP: Rene Paci (Adults only)  Pregnant: No  Office Follow Up:  Does the office need to follow up with this patient?: Yes  Instructions For The Office: can she have a refill please?   Symptoms  Reason For Call & Symptoms: Getting a cold sores on her lips and nose.  Asking if medication can be called in for her.  Reviewed Health History In EMR: Yes  Reviewed Medications In EMR: Yes  Reviewed Allergies In EMR: Yes  Reviewed Surgeries / Procedures: Yes  Date of Onset of Symptoms: 06/27/2012 OB / GYN:  LMP: 06/13/2012  Guideline(s) Used:  Cold Sores - Fever Blisters of Lip  Disposition Per Guideline:   Go to Office Now  Reason For Disposition Reached:   Sores on the eye, eyelids or tip of nose  Advice Given:  N/A  RN Overrode Recommendation:  Patient Requests Prescription  Would like a refill on the Valtrex please If possible to call in the medication please call to the [facility] on [address] in [place].

## 2012-06-27 NOTE — Telephone Encounter (Signed)
Notified pt with regina response! Pt decline on making appt at this time...lmb

## 2012-06-27 NOTE — Telephone Encounter (Signed)
Needs OV.  

## 2013-05-10 ENCOUNTER — Encounter (HOSPITAL_COMMUNITY): Payer: Self-pay | Admitting: Emergency Medicine

## 2013-05-10 ENCOUNTER — Encounter (HOSPITAL_COMMUNITY)
Admission: EM | Disposition: A | Discharge status: Home / Self Care | Payer: Self-pay | Source: Home / Self Care | Attending: Cardiology

## 2013-05-10 ENCOUNTER — Inpatient Hospital Stay (HOSPITAL_COMMUNITY)
Admission: EM | Admit: 2013-05-10 | Discharge: 2013-05-11 | DRG: 251 | Disposition: A | Discharge status: Home / Self Care | Payer: BC Managed Care – PPO | Attending: Cardiology | Admitting: Cardiology

## 2013-05-10 ENCOUNTER — Emergency Department (INDEPENDENT_AMBULATORY_CARE_PROVIDER_SITE_OTHER)
Admission: EM | Admit: 2013-05-10 | Discharge: 2013-05-10 | Disposition: A | Discharge status: Home / Self Care | Payer: BC Managed Care – PPO | Source: Home / Self Care | Attending: Family Medicine | Admitting: Family Medicine

## 2013-05-10 DIAGNOSIS — I959 Hypotension, unspecified: Secondary | ICD-10-CM | POA: Diagnosis present

## 2013-05-10 DIAGNOSIS — Z85850 Personal history of malignant neoplasm of thyroid: Secondary | ICD-10-CM

## 2013-05-10 DIAGNOSIS — F329 Major depressive disorder, single episode, unspecified: Secondary | ICD-10-CM

## 2013-05-10 DIAGNOSIS — F3289 Other specified depressive episodes: Secondary | ICD-10-CM

## 2013-05-10 DIAGNOSIS — E039 Hypothyroidism, unspecified: Secondary | ICD-10-CM | POA: Diagnosis present

## 2013-05-10 DIAGNOSIS — Z923 Personal history of irradiation: Secondary | ICD-10-CM

## 2013-05-10 DIAGNOSIS — I498 Other specified cardiac arrhythmias: Principal | ICD-10-CM | POA: Diagnosis present

## 2013-05-10 DIAGNOSIS — E669 Obesity, unspecified: Secondary | ICD-10-CM | POA: Diagnosis present

## 2013-05-10 DIAGNOSIS — I471 Supraventricular tachycardia: Secondary | ICD-10-CM

## 2013-05-10 DIAGNOSIS — Z79899 Other long term (current) drug therapy: Secondary | ICD-10-CM

## 2013-05-10 DIAGNOSIS — F319 Bipolar disorder, unspecified: Secondary | ICD-10-CM | POA: Diagnosis present

## 2013-05-10 DIAGNOSIS — F411 Generalized anxiety disorder: Secondary | ICD-10-CM | POA: Diagnosis present

## 2013-05-10 HISTORY — PX: SUPRAVENTRICULAR TACHYCARDIA ABLATION: SHX5492

## 2013-05-10 HISTORY — DX: Anxiety disorder, unspecified: F41.9

## 2013-05-10 LAB — CBC WITH DIFFERENTIAL/PLATELET
BASOS PCT: 0 % (ref 0–1)
Basophils Absolute: 0 10*3/uL (ref 0.0–0.1)
EOS ABS: 0.1 10*3/uL (ref 0.0–0.7)
Eosinophils Relative: 1 % (ref 0–5)
HCT: 43.9 % (ref 36.0–46.0)
Hemoglobin: 14.9 g/dL (ref 12.0–15.0)
Lymphocytes Relative: 33 % (ref 12–46)
Lymphs Abs: 3 10*3/uL (ref 0.7–4.0)
MCH: 30.5 pg (ref 26.0–34.0)
MCHC: 33.9 g/dL (ref 30.0–36.0)
MCV: 90 fL (ref 78.0–100.0)
Monocytes Absolute: 0.9 10*3/uL (ref 0.1–1.0)
Monocytes Relative: 10 % (ref 3–12)
NEUTROS PCT: 57 % (ref 43–77)
Neutro Abs: 5.2 10*3/uL (ref 1.7–7.7)
Platelets: 247 10*3/uL (ref 150–400)
RBC: 4.88 MIL/uL (ref 3.87–5.11)
RDW: 13 % (ref 11.5–15.5)
WBC: 9.2 10*3/uL (ref 4.0–10.5)

## 2013-05-10 LAB — CBC
HCT: 39.1 % (ref 36.0–46.0)
HEMOGLOBIN: 12.9 g/dL (ref 12.0–15.0)
MCH: 30.3 pg (ref 26.0–34.0)
MCHC: 33 g/dL (ref 30.0–36.0)
MCV: 91.8 fL (ref 78.0–100.0)
Platelets: 228 10*3/uL (ref 150–400)
RBC: 4.26 MIL/uL (ref 3.87–5.11)
RDW: 13.3 % (ref 11.5–15.5)
WBC: 11.1 10*3/uL — ABNORMAL HIGH (ref 4.0–10.5)

## 2013-05-10 LAB — TROPONIN I: TROPONIN I: 0.57 ng/mL — AB (ref <0.30)

## 2013-05-10 LAB — POCT I-STAT, CHEM 8
BUN: 15 mg/dL (ref 6–23)
Calcium, Ion: 1.06 mmol/L — ABNORMAL LOW (ref 1.12–1.23)
Chloride: 110 mEq/L (ref 96–112)
Creatinine, Ser: 0.8 mg/dL (ref 0.50–1.10)
Glucose, Bld: 109 mg/dL — ABNORMAL HIGH (ref 70–99)
HEMATOCRIT: 46 % (ref 36.0–46.0)
Hemoglobin: 15.6 g/dL — ABNORMAL HIGH (ref 12.0–15.0)
POTASSIUM: 3.5 meq/L — AB (ref 3.7–5.3)
SODIUM: 141 meq/L (ref 137–147)
TCO2: 18 mmol/L (ref 0–100)

## 2013-05-10 LAB — CREATININE, SERUM
CREATININE: 0.86 mg/dL (ref 0.50–1.10)
GFR calc Af Amer: >90 mL/min (ref >90)
GFR calc non Af Amer: 78 mL/min — ABNORMAL LOW (ref >90)

## 2013-05-10 LAB — MAGNESIUM: Magnesium: 1.8 mg/dL (ref 1.5–2.5)

## 2013-05-10 LAB — TSH: TSH: 5.77 u[IU]/mL — AB (ref 0.350–4.500)

## 2013-05-10 LAB — POCT I-STAT TROPONIN I: Troponin i, poc: 0.02 ng/mL (ref 0.00–0.08)

## 2013-05-10 SURGERY — SUPRAVENTRICULAR TACHYCARDIA ABLATION
Anesthesia: LOCAL

## 2013-05-10 MED ORDER — MIDAZOLAM HCL 5 MG/5ML IJ SOLN
INTRAMUSCULAR | Status: AC
  Filled 2013-05-10: qty 5

## 2013-05-10 MED ORDER — AMIODARONE HCL IN DEXTROSE 360-4.14 MG/200ML-% IV SOLN: 30.0000 mg/h | INTRAVENOUS | Status: DC

## 2013-05-10 MED ORDER — AMIODARONE LOAD VIA INFUSION
150.0000 mg | Freq: Once | INTRAVENOUS | Status: DC
  Filled 2013-05-10: qty 83.34

## 2013-05-10 MED ORDER — ONDANSETRON HCL 4 MG/2ML IJ SOLN: 4.0000 mg | Freq: Four times a day (QID) | INTRAMUSCULAR | Status: DC | PRN

## 2013-05-10 MED ORDER — ADENOSINE 6 MG/2ML IV SOLN
6.0000 mg | Freq: Once | INTRAVENOUS | Status: AC
  Administered 2013-05-10: 6 mg via INTRAVENOUS

## 2013-05-10 MED ORDER — POTASSIUM CHLORIDE CRYS ER 20 MEQ PO TBCR
40.0000 meq | EXTENDED_RELEASE_TABLET | Freq: Once | ORAL | Status: AC
  Administered 2013-05-10: 21:00:00 40 meq via ORAL
  Filled 2013-05-10: qty 2

## 2013-05-10 MED ORDER — SODIUM CHLORIDE 0.9 % IJ SOLN: 3.0000 mL | Freq: Two times a day (BID) | INTRAMUSCULAR | Status: DC

## 2013-05-10 MED ORDER — ETOMIDATE 2 MG/ML IV SOLN
10.0000 mg | Freq: Once | INTRAVENOUS | Status: AC
  Administered 2013-05-10: 10 mg via INTRAVENOUS
  Filled 2013-05-10: qty 10

## 2013-05-10 MED ORDER — ADENOSINE 6 MG/2ML IV SOLN
INTRAVENOUS | Status: AC
  Filled 2013-05-10: qty 2

## 2013-05-10 MED ORDER — ASPIRIN EC 81 MG PO TBEC
81.0000 mg | DELAYED_RELEASE_TABLET | Freq: Every day | ORAL | Status: DC
  Filled 2013-05-10: qty 1

## 2013-05-10 MED ORDER — ACETAMINOPHEN 325 MG PO TABS: 650.0000 mg | ORAL_TABLET | ORAL | Status: DC | PRN

## 2013-05-10 MED ORDER — SODIUM CHLORIDE 0.9 % IV BOLUS (SEPSIS)
500.0000 mL | Freq: Once | INTRAVENOUS | Status: AC
  Administered 2013-05-10: 500 mL via INTRAVENOUS

## 2013-05-10 MED ORDER — OFF THE BEAT BOOK
Freq: Once | Status: AC
  Administered 2013-05-10: 22:00:00
  Filled 2013-05-10: qty 1

## 2013-05-10 MED ORDER — METOPROLOL TARTRATE 1 MG/ML IV SOLN
5.0000 mg | Freq: Once | INTRAVENOUS | Status: AC
  Administered 2013-05-10: 5 mg via INTRAVENOUS
  Filled 2013-05-10: qty 5

## 2013-05-10 MED ORDER — BUPIVACAINE HCL (PF) 0.25 % IJ SOLN
INTRAMUSCULAR | Status: AC
  Filled 2013-05-10: qty 60

## 2013-05-10 MED ORDER — LIDOCAINE HCL (PF) 1 % IJ SOLN
INTRAMUSCULAR | Status: AC
  Filled 2013-05-10: qty 30

## 2013-05-10 MED ORDER — ADENOSINE 6 MG/2ML IV SOLN
12.0000 mg | Freq: Once | INTRAVENOUS | Status: AC
  Administered 2013-05-10: 12 mg via INTRAVENOUS
  Filled 2013-05-10: qty 4

## 2013-05-10 MED ORDER — ADENOSINE 6 MG/2ML IV SOLN
12.0000 mg | Freq: Once | INTRAVENOUS | Status: AC
  Administered 2013-05-10: 12 mg via INTRAVENOUS

## 2013-05-10 MED ORDER — AMIODARONE HCL IN DEXTROSE 360-4.14 MG/200ML-% IV SOLN: 60.0000 mg/h | INTRAVENOUS | Status: DC

## 2013-05-10 MED ORDER — FENTANYL CITRATE 0.05 MG/ML IJ SOLN
INTRAMUSCULAR | Status: AC
  Filled 2013-05-10: qty 2

## 2013-05-10 MED ORDER — CLONAZEPAM 0.5 MG PO TABS: 0.5000 mg | ORAL_TABLET | Freq: Two times a day (BID) | ORAL | Status: DC | PRN

## 2013-05-10 MED ORDER — ESCITALOPRAM OXALATE 20 MG PO TABS
20.0000 mg | ORAL_TABLET | Freq: Every day | ORAL | Status: DC
  Filled 2013-05-10 (×2): qty 1

## 2013-05-10 MED ORDER — NITROGLYCERIN 0.4 MG SL SUBL: 0.4000 mg | SUBLINGUAL_TABLET | SUBLINGUAL | Status: DC | PRN

## 2013-05-10 MED ORDER — SODIUM CHLORIDE 0.9 % IJ SOLN: 3.0000 mL | INTRAMUSCULAR | Status: DC | PRN

## 2013-05-10 MED ORDER — HEPARIN SODIUM (PORCINE) 5000 UNIT/ML IJ SOLN
5000.0000 [IU] | Freq: Three times a day (TID) | INTRAMUSCULAR | Status: DC
  Administered 2013-05-11: 5000 [IU] via SUBCUTANEOUS
  Filled 2013-05-10 (×5): qty 1

## 2013-05-10 MED ORDER — SODIUM CHLORIDE 0.9 % IV SOLN: Freq: Once | INTRAVENOUS | Status: DC

## 2013-05-10 MED ORDER — LEVOTHYROXINE SODIUM 150 MCG PO TABS
150.0000 ug | ORAL_TABLET | Freq: Every day | ORAL | Status: DC
  Administered 2013-05-11: 150 ug via ORAL
  Filled 2013-05-10 (×2): qty 1

## 2013-05-10 MED ORDER — SODIUM CHLORIDE 0.9 % IV SOLN: 250.0000 mL | INTRAVENOUS | Status: DC | PRN

## 2013-05-10 NOTE — H&P (View-Only) (Signed)
  Reason for Consult:Incessant SVT  Referring Physician: [name] is an 47 y.o. female.   HPI: the patient is a pleasant 47 yo woman who has had SVT for 35 years. She has learned over time to valsalva and break the spells. The times she has required ER visit and Adenosine. She has had both IV adenosine as well as DCCV and had immediate return of SVT. She is now referred for urgent ablation. She is hypotensive with SBP in the 80's. She has not had syncope but is anxiousl and tearful. She notes chest pressure and sob. PMH: Past Medical History  Diagnosis Date  . OBESITY   . EATING DISORDER, HX OF   . Hypothyroidism, postsurgical   . Depression   . Bipolar disorder   . Thyroid cancer     a. Dx 1995->resection and xrt, now on levothyroxine.  . SVT (supraventricular tachycardia)     a. diagnosed at age 8 - has never seen cardiology.    PSHX: Past Surgical History  Procedure Laterality Date  . Total thyroidectomy  1995  . Appendectomy  2004  . Cesarean section  1986, 1996  . Anterior cruciate ligament repair  2012    right, a collins  . Lipoma excision      neck x 2    FAMHX: Family History  Problem Relation Age of Onset  . Alzheimer's disease Mother     currently 70.  . Colon cancer Father 56    currently 71.  . Alzheimer's disease Maternal Grandmother   . Stomach cancer Neg Hx     Social History:  reports that she has never smoked. She does not have any smokeless tobacco history on file. She reports that she drinks alcohol. She reports that she does not use illicit drugs.  Allergies: No Known Allergies  Medications: reviewed  No results found.  ROS  As stated in the HPI and negative for all other systems.  Physical Exam  Vitals:Blood pressure 93/73, pulse 168, temperature 97.7 F (36.5 C), temperature source Oral, resp. rate 13, last menstrual period 04/26/2013, SpO2 99.00%.  anxious appearingmiddle aged woman, NAD HEENT: Unremarkable Neck:   No JVD, no thyromegally Back:  No CVA tenderness Lungs:  Clear except for rare basilar rales. HEART:  Regular tachy rhythm, no murmurs, no rubs, no clicks Abd:  Flat, positive bowel sounds, no organomegally, no rebound, no guarding Ext:  2 plus pulses, no edema, no cyanosis, no clubbing Skin:  No rashes no nodules Neuro:  CN II through XII intact, motor grossly intact  Assessment/Plan: 1. Incessant SVT 2. H/o eating disorder Rec: I have discussed the treatment options with the patient and her family. She would like to proceed with catheter ablation. The risks/benefits/goals/expectations of ablation have been discussed with the patient and she wishes to proceed.  Gregg Taylor,M.D.  Gregg TaylorMD 05/10/2013, 3:11 PM      

## 2013-05-10 NOTE — Consult Note (Signed)
  Reason for Consult:Incessant SVT  Referring Physician: Simrin Vegh is an 48 y.o. female.   HPI: the patient is a pleasant [name] who has had SVT for 35 years. She has learned over time to valsalva and break the spells. The times she has required ER visit and Adenosine. She has had both IV adenosine as well as DCCV and had immediate return of SVT. She is now referred for urgent ablation. She is hypotensive with SBP in the 80's. She has not had syncope but is anxiousl and tearful. She notes chest pressure and sob. PMH: Past Medical History  Diagnosis Date  . OBESITY   . EATING DISORDER, HX OF   . Hypothyroidism, postsurgical   . Depression   . Bipolar disorder   . Thyroid cancer     a. Dx 1995->resection and xrt, now on levothyroxine.  . SVT (supraventricular tachycardia)     a. diagnosed at age 63 - has never seen cardiology.    PSHX: Past Surgical History  Procedure Laterality Date  . Total thyroidectomy  1995  . Appendectomy  2004  . Cesarean section  1986, 1996  . Anterior cruciate ligament repair  2012    right, a collins  . Lipoma excision      neck x 2    FAMHX: Family History  Problem Relation Age of Onset  . Alzheimer's disease Mother     currently 57.  . Colon cancer Father 17    currently 8.  . Alzheimer's disease Maternal Grandmother   . Stomach cancer Neg Hx     Social History:  reports that she has never smoked. She does not have any smokeless tobacco history on file. She reports that she drinks alcohol. She reports that she does not use illicit drugs.  Allergies: No Known Allergies  Medications: reviewed  No results found.  ROS  As stated in the HPI and negative for all other systems.  Physical Exam  Vitals:Blood pressure 93/73, pulse 168, temperature 97.7 F (36.5 C), temperature source Oral, resp. rate 13, last menstrual period 04/26/2013, SpO2 99.00%.  anxious appearingmiddle aged York, NAD HEENT: Unremarkable Neck:   No JVD, no thyromegally Back:  No CVA tenderness Lungs:  Clear except for rare basilar rales. HEART:  Regular tachy rhythm, no murmurs, no rubs, no clicks Abd:  Flat, positive bowel sounds, no organomegally, no rebound, no guarding Ext:  2 plus pulses, no edema, no cyanosis, no clubbing Skin:  No rashes no nodules Neuro:  CN II through XII intact, motor grossly intact  Assessment/Plan: 1. Incessant SVT 2. H/o eating disorder Rec: I have discussed the treatment options with the patient and her family. She would like to proceed with catheter ablation. The risks/benefits/goals/expectations of ablation have been discussed with the patient and she wishes to proceed.  Delena Casebeer,M.D.  Carleene Overlie TaylorMD 05/10/2013, 3:Hailey PM

## 2013-05-10 NOTE — Progress Notes (Signed)
CRITICAL VALUE ALERT  Critical value received: Troponin 0.57 Date of notification:  05/10/13  Time of notification:  2022 Critical value read back:yes  Nurse who received alert: Doug Sou RN  MD notified (1st page):  Dr Elias Else   Time of first page: 2030  Responding MD:  Dr Elias Else, no orders at this time Time MD responded:  2035

## 2013-05-10 NOTE — ED Notes (Signed)
[facility] has been given report Report given to [facility], Camera operator at [facility] ED

## 2013-05-10 NOTE — ED Provider Notes (Signed)
[name] is a 48 y.o. female who presents to Urgent Care today for SVT. Patient has a long history of SVT last requiring conversion in 2010. She began having palpitations at 1 AM this morning. She typically convert spontaneously on her own. She notes some lightheadedness but denies any chest pain or shortness of breath. She denies any leg swelling. No nausea vomiting or diarrhea.   Past Medical History  Diagnosis Date  . OBESITY   . EATING DISORDER, HX OF   . Hypothyroidism, postsurgical 1994  . Depression   . Bipolar disorder   . Thyroid cancer 1995    resection and xrt   History  Substance Use Topics  . Smoking status: Never Smoker   . 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 Alcohol Use: Yes     Comment: Occassional   ROS as above Medications: No current facility-administered medications for this encounter.   Current Outpatient Prescriptions  Medication Sig Dispense Refill  . levothyroxine (SYNTHROID, LEVOTHROID) 150 MCG tablet Take 1 tablet (150 mcg total) by mouth daily.  30 tablet  5  . levothyroxine (SYNTHROID, LEVOTHROID) 150 MCG tablet TAKE 1 TABLET BY MOUTH EVERY DAY  30 tablet  5  . phentermine 37.5 MG capsule Take 1 capsule (37.5 mg total) by mouth every morning.  30 capsule  0    Exam:  BP 105/74  Pulse 196  Temp(Src) 97.9 F (36.6 C) (Oral)  Resp 22  SpO2 100%  LMP 04/26/2013 Gen: Well NAD obese HEENT: EOMI,  MMM Lungs: Normal work of breathing. CTABL Heart: Tachycardia but regular no MRG Abd: NABS, Soft. NT, ND Exts: Brisk capillary refill, warm and well perfused.   Twelve-lead EKG shows SVT at around 200 beats per minute. Valgus procedure and carotid body massage did not improve patient's heart rate Patient was given 6 mg of adenosine which worked for about 15 seconds however she spontaneously reverted back to SVT. She was given 12 mg of adenosine which worked for approximately 3 or 4  minutes. 12-lead EKG at that time showed sinus tachycardia at 110 beats per minute with no ST segment elevations or depressions. However she spontaneously reverted back to SVT after a few minutes. At that time EMS had arrived and patient was transported to the emergency room.   Assessment and Plan: 48 y.o. female with SVT despite valgus procedure, carotid body massage, 6 mg and 12 mg of adenosine. Transferred to ER ASAP via EMS.   Discussed warning signs or symptoms. Please see discharge instructions. Patient expresses understanding.    Gregor Hams, MD 05/10/13 1010

## 2013-05-10 NOTE — ED Notes (Signed)
Shocked pt with 75 Joules.

## 2013-05-10 NOTE — Interval H&P Note (Signed)
History and Physical Interval Note:  05/10/2013 3:47 PM  [name]  has presented today for surgery, with the diagnosis of SVT  The various methods of treatment have been discussed with the patient and family. After consideration of risks, benefits and other options for treatment, the patient has consented to  Procedure(s): SUPRAVENTRICULAR TACHYCARDIA ABLATION (N/A) as a surgical intervention .  The patient's history has been reviewed, patient examined, no change in status, stable for surgery.  I have reviewed the patient's chart and labs.  Questions were answered to the patient's satisfaction.     [name]

## 2013-05-10 NOTE — CV Procedure (Signed)
EPS/RFA of AVNRT (incessant) without immediate complication. Z#[id number].

## 2013-05-10 NOTE — ED Notes (Signed)
Gave 6 adenosine drip first Patient went out of SVT for a minute then back to SVT Giving her 12 adenosine drip

## 2013-05-10 NOTE — Discharge Summary (Signed)
ELECTROPHYSIOLOGY PROCEDURE DISCHARGE SUMMARY    Patient ID: [name],  MRN: [id number], DOB/AGE: [birth year] 48 y.o.  Admit date: 05/10/2013 Discharge date: 05/11/2013  Primary Care Physician: Gwendolyn Grant, MD Electrophysiologist: [name], MD  Primary Discharge Diagnosis:  Supraventricular tachycardia status post ablation this admission  Secondary Discharge Diagnosis:  1.  Hypothyroidism 2.  Thyroid cancer - dx 1995, s/p resection and XRT, now on levothyroxine 3.  Obesity  No Known Allergies  Procedures This Admission:  1.  Electrophysiology study and radiofrequency catheter ablation on 05-10-2013 by Dr Lovena Le.  This study demonstrated successful ablation of AVNRT.  See Dr Tanna Furry op note for full details.  There were no early apparent complications.   Brief HPI/Hosp Course: [name] is a 48 y.o. female who has had SVT for 35 years. She has learned over time to valsalva and break the spells. The times she has required ER visit and Adenosine. She has had both IV adenosine as well as DCCV and had immediate return of SVT. She is now referred for urgent ablation. She is hypotensive with SBP in the 80's. She has not had syncope but is anxiousl and tearful. She notes chest pressure and sob.  She went to the urgent care center on the day of admission with shortness of breath and chest pressure.  She was found to be in SVT that did not convert with adenosine.  She was transferred to [facility] for further evaluation.  She was seen by Dr Lovena Le who recommended catheter ablation for incessant SVT. Risks, benefits, and alternatives were reviewed with the patient who wished to proceed.  She underwent ablation on 05-10-13 with details as outlined above. She was monitored on telemetry overnight which demonstrated sinus rhythm.  Her groin and neck incisions were without complication.  She was evaluated by Dr Lovena Le and considered stable for discharge to home.  Elevated troponin felt  to be secondary to long period of time in SVT.    Discharge Vitals: Blood pressure 92/58, pulse 77, temperature 98.1 F (36.7 C), temperature source Oral, resp. rate 18, height 5\' 2"  (1.575 m), last menstrual period 04/26/2013, SpO2 99.00%.    Labs:   Lab Results  Component Value Date   WBC 11.1* 05/10/2013   HGB 12.9 05/10/2013   HCT 39.1 05/10/2013   MCV 91.8 05/10/2013   PLT 228 05/10/2013     Recent Labs Lab 05/11/13 0200  NA 138  K 3.9  CL 106  CO2 21  BUN 17  CREATININE 0.80  CALCIUM 8.2*  PROT 5.9*  BILITOT 0.2*  ALKPHOS 69  ALT 16  AST 17  GLUCOSE 101*    Discharge Medications:    Medication List    ASK your doctor about these medications       clonazePAM 0.5 MG tablet  Commonly known as:  KLONOPIN  Take 0.5 mg by mouth 2 (two) times daily as needed for anxiety.     escitalopram 20 MG tablet  Commonly known as:  LEXAPRO  Take 20 mg by mouth daily.     GOODY HEADACHE PO  Take 1 Package by mouth daily as needed (for pain).     levothyroxine 150 MCG tablet  Commonly known as:  SYNTHROID, LEVOTHROID  Take 1 tablet (150 mcg total) by mouth daily.        Disposition:   Future Appointments Provider Department Dept Phone   06/12/2013 3:30 PM Andrez Grime, PA-C [place] Office [phone]  Duration of Discharge Encounter: Greater than 30 minutes including physician time.  Signed, Mikle Bosworth.D.

## 2013-05-10 NOTE — H&P (Signed)
Patient ID: [name] MRN: [id number], DOB/AGE: 50-06-1965   Admit date: 05/10/2013   Primary Physician: Hailey Grant, MD Primary Cardiologist: new - K. Meda Coffee, MD   Pt. Profile:  48 y/o female with h/o SVT since age 42, who presented to the ED this morning with recurrent, persistent SVT.  Problem List  Past Medical History  Diagnosis Date  . OBESITY   . EATING DISORDER, HX OF   . Hypothyroidism, postsurgical   . Depression   . Bipolar disorder   . Thyroid cancer     a. Dx 1995->resection and xrt, now on levothyroxine.  . SVT (supraventricular tachycardia)     a. diagnosed at age 38 - has never seen cardiology.    Past Surgical History  Procedure Laterality Date  . Total thyroidectomy  1995  . Appendectomy  2004  . Cesarean section  1986, 1996  . Anterior cruciate ligament repair  2012    right, a collins  . Lipoma excision      neck x 2    Allergies  No Known Allergies  HPI  48 y/o female with a h/o SVT dating back to age 77.  She has never seen cardiology in the past and was told as a child that she would probably grow out of it.  Typically SVT would occur a few times/yr, last about 5 mins and resolve with bearing down or carotid massage.  She is not on any maintenance meds to prevent SVT.  This AM at 1A, she was still awake at home and noted sudden onset of tachypalpitations associated with dyspnea, which is her usual course of Ss.  She tried bearing down and carotid massage w/o breaking of SVT.  She presented to urgent care this AM and was given 6mg  of adenosine and momentarily broke but quickly returned to SVT.  She was then given 12mg  and again broke, but again, this was short lived.  Decision was then made to transfer to the ED for further eval.  In the ED she was given IV lopressor and another round of adenosine 6mg , briefly breaking and then returning to SVT.  Anesthesia was then called and she was cardioverted, but again, only maintained sinus rhythm  for a short period of time.  Her blood pressures throughout this episode has been 80's to low 100's.  Other than tachypalpitations, she has not had chest pain or significant dyspnea.  Potassium is mildly low @ 3.5.  Mg/TSH will be sent off.  Home Medications  Prior to Admission medications   Medication Sig Start Date End Date Taking? Authorizing Provider  Aspirin-Acetaminophen-Caffeine (GOODY HEADACHE PO) Take 1 Package by mouth daily as needed (for pain).   Yes Historical Provider, MD  clonazePAM (KLONOPIN) 0.5 MG tablet Take 0.5 mg by mouth 2 (two) times daily as needed for anxiety.   Yes Historical Provider, MD  escitalopram (LEXAPRO) 20 MG tablet Take 20 mg by mouth daily.   Yes Historical Provider, MD  levothyroxine (SYNTHROID, LEVOTHROID) 150 MCG tablet Take 1 tablet (150 mcg total) by mouth daily. 06/05/12  Yes Hailey Clack, MD   Family History  Family History  Problem Relation Age of Onset  . Alzheimer's disease Mother     currently 19.  . Colon cancer Father 55    currently 37.  . Alzheimer's disease Maternal Grandmother   . Stomach cancer Neg Hx    Social History  History   Social History  . Marital Status: Married  Spouse Name: N/A    Number of Children: N/A  . Years of Education: N/A   Occupational History  . Not on file.   Social History Main Topics  . Smoking status: Never Smoker   . 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 Alcohol Use: Yes     Comment: Occassional - maybe twice/month.  . Drug Use: No  . Sexual Activity: Not on file   Other Topics Concern  . Not on file   Social History Narrative   Lives in [place] with husband and son.  Chemical engineer.    Review of Systems General:  No chills, fever, night sweats or weight changes.  Cardiovascular:  +++ tachypalpitations and dyspnea.  No chest pain, edema, orthopnea, paroxysmal nocturnal dyspnea. Dermatological: No rash,  lesions/masses Respiratory: No cough, dyspnea Urologic: No hematuria, dysuria Abdominal:   No nausea, vomiting, diarrhea, bright red blood per rectum, melena, or hematemesis Neurologic:  No visual changes, wkns, changes in mental status. All other systems reviewed and are otherwise negative except as noted above.  Physical Exam  Blood pressure 93/73, pulse 168, temperature 97.7 F (36.5 C), temperature source Oral, resp. rate 13, last menstrual period 04/26/2013, SpO2 99.00%.  General: Pleasant, NAD Psych: Normal affect. Neuro: Alert and oriented X 3. Moves all extremities spontaneously. HEENT: Normal  Neck: Supple without bruits or JVD. Lungs:  Resp regular and unlabored, CTA. Heart: RRR, tachy, no s3, s4, or murmurs. Abdomen: Soft, non-tender, non-distended, BS + x 4.  Extremities: No clubbing, cyanosis or edema. DP/PT/Radials 2+ and equal bilaterally.  Labs  Troponin [facility] of Care Test)  Recent Labs  05/10/13 1051  TROPIPOC 0.02    Lab Results  Component Value Date   WBC 9.2 05/10/2013   HGB 15.6* 05/10/2013   HCT 46.0 05/10/2013   MCV 90.0 05/10/2013   PLT 247 05/10/2013     Recent Labs Lab 05/10/13 1053  NA 141  K 3.5*  CL 110  BUN 15  CREATININE 0.80  GLUCOSE 109*   Lab Results  Component Value Date   CHOL 158 12/02/2010   HDL 55.70 12/02/2010   LDLCALC 86 12/02/2010   TRIG 84.0 12/02/2010   Radiology/Studies  No results found.  ECG  Svt, 183, slight upsloping inferolateral st depression.  ASSESSMENT AND PLAN  1.  SVT:  Pt with a long h/o SVT, which usually breaks with vagal maneuvers at home.  She has never previously been seen by cardiology.  This episode, since 1AM, has lasted much longer than usual.  She did briefly respond to adenosine x 3 and cardioversion, but each time reverted to SVT.  BP's have been soft.  Supp K, check Mg, TSH, echo.  Will add IV amio to see if we can keep her SVT at [facility] and will ask EP to see for consideration of RFCA.  2.   Hypothyroidism: in setting of prior thyroidectomy 2/2 cancer.  Check TSH.  3.  Bipolar disorder:  Cont home meds.  4.  Obesity:  Nutritional counseling.  Signed, Hailey Hodgkins, NP 05/10/2013, 2:25 PM   The patient was seen, examined and discussed with Hailey Rima, NP and agree as above.  In summary, this is a very pleasant female with h/o bipolar disorder, thyroid ca, currently on replacement therapy with synthroid, and h/o SVT that started at age of 19. She has never seen a cardiologist. They usually occur every 1-5 months and last only  few minutes. She learned how to manage them with Valsalva maneuvers. Few times she needed medical cardioversion.  She started to have palpitations the last night that didn't go away with any standard maneuvers, came to the ER where she failed adenosine x 3 and DCCV, she was able to keep SR only for 3 minutes. Currently in SVT with ventricular rate 168 BPM. We will consult EP for an ablation. Meanwhile we will start Diltiazem drip.   [name], [name] 05/10/2013

## 2013-05-10 NOTE — ED Notes (Signed)
Pt from [facility], SVT. Pt states usually converts with vagaling. Pt received 6 mg and 12 mg adenosine with brief conversion.

## 2013-05-10 NOTE — ED Notes (Signed)
C/o SVT since 1 am [place] ER dx her with SVT  States mostly time SVT will stop on its on but when it doesn't she goes to ER

## 2013-05-10 NOTE — ED Provider Notes (Signed)
CSN: [id number]     Arrival date & time 05/10/13  73 History   First MD Initiated Contact with Patient 05/10/13 1029     No chief complaint on file.  (Consider location/radiation/quality/duration/timing/severity/associated sxs/prior Treatment) The history is provided by the patient.   patient presents from urgent care with SVT. She has a history of the same since he was 48 years old. She has occasional episodes of an. She received 6 and then 12 mg of adenosine with brief conversion but returned SVT. She's had a mildly decreased blood pressure. She feels somewhat fatigued. Her chest. No fevers. No cough.  Past Medical History  Diagnosis Date  . OBESITY   . EATING DISORDER, HX OF   . Hypothyroidism, postsurgical   . Depression   . Bipolar disorder   . Thyroid cancer     a. Dx 1995->resection and xrt, now on levothyroxine.  . SVT (supraventricular tachycardia)     a. diagnosed at age 33 - has never seen cardiology.   Past Surgical History  Procedure Laterality Date  . Total thyroidectomy  1995  . Appendectomy  2004  . Cesarean section  1986, 1996  . Anterior cruciate ligament repair  2012    right, a collins  . Lipoma excision      neck x 2   Family History  Problem Relation Age of Onset  . Alzheimer's disease Mother     currently 32.  . Colon cancer Father 58    currently 57.  . Alzheimer's disease Maternal Grandmother   . Stomach cancer Neg Hx    History  Substance Use Topics  . Smoking status: Never Smoker   . 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 Alcohol Use: Yes     Comment: Occassional - maybe twice/month.   OB History   Grav Para Term Preterm Abortions TAB SAB Ect Mult Living                 Review of Systems  Constitutional: Negative for activity change and appetite change.  Eyes: Negative for pain.  Respiratory: Positive for chest tightness. Negative for shortness of breath.   Cardiovascular:  Positive for palpitations. Negative for chest pain and leg swelling.  Gastrointestinal: Negative for nausea, vomiting, abdominal pain and diarrhea.  Genitourinary: Negative for flank pain.  Musculoskeletal: Negative for back pain and neck stiffness.  Skin: Negative for rash.  Neurological: Negative for weakness, numbness and headaches.  Psychiatric/Behavioral: Negative for behavioral problems.    Allergies  Review of patient's allergies indicates no known allergies.  Home Medications   Current Outpatient Rx  Name  Route  Sig  Dispense  Refill  . Aspirin-Acetaminophen-Caffeine (GOODY HEADACHE PO)   Oral   Take 1 Package by mouth daily as needed (for pain).         . clonazePAM (KLONOPIN) 0.5 MG tablet   Oral   Take 0.5 mg by mouth 2 (two) times daily as needed for anxiety.         Marland Kitchen escitalopram (LEXAPRO) 20 MG tablet   Oral   Take 20 mg by mouth daily.         Marland Kitchen levothyroxine (SYNTHROID, LEVOTHROID) 150 MCG tablet   Oral   Take 1 tablet (150 mcg total) by mouth daily.   30 tablet   5    BP 93/73  Pulse 168  Temp(Src) 97.7 F (36.5 C) (Oral)  Resp 13  SpO2 99%  LMP 04/26/2013 Physical Exam  Nursing note and vitals reviewed. Constitutional: She is oriented to person, place, and time. She appears well-developed and well-nourished.  HENT:  Head: Normocephalic and atraumatic.  Eyes: EOM are normal. Pupils are equal, round, and reactive to light.  Neck: Normal range of motion. Neck supple.  Cardiovascular: Regular rhythm and normal heart sounds.   No murmur heard. Tachycardia  Pulmonary/Chest: Effort normal and breath sounds normal. No respiratory distress. She has no wheezes. She has no rales.  Abdominal: Soft. Bowel sounds are normal. She exhibits no distension. There is no tenderness. There is no rebound and no guarding.  Musculoskeletal: Normal range of motion.  Neurological: She is alert and oriented to person, place, and time. No cranial nerve deficit.   Skin: Skin is warm and dry.  Psychiatric: She has a normal mood and affect. Her speech is normal.    ED Course  CARDIOVERSION Date/Time: 05/10/2013 1:25 PM Performed by: Davonna Belling R. Authorized by: Mackie Pai Consent: Verbal consent obtained. written consent obtained. Risks and benefits: risks, benefits and alternatives were discussed Consent given by: patient Patient understanding: patient states understanding of the procedure being performed Patient consent: the patient's understanding of the procedure matches consent given Procedure consent: procedure consent matches procedure scheduled Relevant documents: relevant documents present and verified Test results: test results available and properly labeled Site marked: the operative site was marked Imaging studies: imaging studies available Required items: required blood products, implants, devices, and special equipment available Patient identity confirmed: verbally with patient and arm band Time out: Immediately prior to procedure a "time out" was called to verify the correct patient, procedure, equipment, support staff and site/side marked as required. Patient sedated: yes Sedation type: moderate (conscious) sedation Sedatives: etomidate Sedation start date/time: 05/10/2013 1:25 PM Sedation end date/time: 05/10/2013 1:30 PM Vitals: Vital signs were monitored during sedation. Cardioversion basis: emergent Pre-procedure rhythm: supraventricular tachycardia Patient position: patient was placed in a supine position Chest area: chest area exposed Electrodes: pads Electrodes placed: anterior-posterior Number of attempts: 1 Attempt 1 mode: synchronous Attempt 1 waveform: biphasic Attempt 1 shock (in Joules): 75 Attempt 1 outcome: conversion to normal sinus rhythm Post-procedure rhythm: supraventricular tachycardia Complications: no complications Patient tolerance: Patient tolerated the procedure well with no  immediate complications. Comments: Return to SVT after a few minutes   (including critical care time) Labs Review Labs Reviewed  TSH - Abnormal; Notable for the following:    TSH 5.770 (*)    All other components within normal limits  POCT I-STAT, CHEM 8 - Abnormal; Notable for the following:    Potassium 3.5 (*)    Glucose, Bld 109 (*)    Calcium, Ion 1.06 (*)    Hemoglobin 15.6 (*)    All other components within normal limits  CBC WITH DIFFERENTIAL  POCT I-STAT TROPONIN I   Imaging Review No results found.  EKG Interpretation   None       MDM   1. Sustained SVT   2. Depressive disorder, not elsewhere classified   3. Unspecified hypothyroidism    Patient with SVT. History of same. Briefly converted with adenosine again she again after synchronized cardioversion. She has some mild hypotension with it. His been going since approximately 1 in the morning. She has been seen by cardiology in the ED and will be taken for ablation  CRITICAL CARE Performed by: Mackie Pai. Total critical care time:30 Critical care time was exclusive of separately billable procedures and treating  other patients. Critical care was necessary to treat or prevent imminent or life-threatening deterioration. Critical care was time spent personally by me on the following activities: development of treatment plan with patient and/or surrogate as well as nursing, discussions with consultants, evaluation of patient's response to treatment, examination of patient, obtaining history from patient or surrogate, ordering and performing treatments and interventions, ordering and review of laboratory studies, ordering and review of radiographic studies, pulse oximetry and re-evaluation of patient's condition.  Jasper Riling. Alvino Chapel, MD 05/10/13 [id number]

## 2013-05-11 ENCOUNTER — Encounter (HOSPITAL_COMMUNITY): Payer: Self-pay | Admitting: *Deleted

## 2013-05-11 LAB — LIPID PANEL
Cholesterol: 134 mg/dL (ref 0–200)
HDL: 31 mg/dL — ABNORMAL LOW (ref >39)
LDL Cholesterol: 78 mg/dL (ref 0–99)
TRIGLYCERIDES: 125 mg/dL (ref <150)
Total CHOL/HDL Ratio: 4.3 RATIO
VLDL: 25 mg/dL (ref 0–40)

## 2013-05-11 LAB — COMPREHENSIVE METABOLIC PANEL
ALK PHOS: 69 U/L (ref 39–117)
ALT: 16 U/L (ref 0–35)
AST: 17 U/L (ref 0–37)
Albumin: 3.2 g/dL — ABNORMAL LOW (ref 3.5–5.2)
BUN: 17 mg/dL (ref 6–23)
CALCIUM: 8.2 mg/dL — AB (ref 8.4–10.5)
CO2: 21 meq/L (ref 19–32)
Chloride: 106 mEq/L (ref 96–112)
Creatinine, Ser: 0.8 mg/dL (ref 0.50–1.10)
GFR calc Af Amer: >90 mL/min (ref >90)
GFR, EST NON AFRICAN AMERICAN: 85 mL/min — AB (ref >90)
Glucose, Bld: 101 mg/dL — ABNORMAL HIGH (ref 70–99)
POTASSIUM: 3.9 meq/L (ref 3.7–5.3)
SODIUM: 138 meq/L (ref 137–147)
Total Bilirubin: 0.2 mg/dL — ABNORMAL LOW (ref 0.3–1.2)
Total Protein: 5.9 g/dL — ABNORMAL LOW (ref 6.0–8.3)

## 2013-05-11 LAB — TROPONIN I
Troponin I: 0.66 ng/mL (ref <0.30)
Troponin I: 0.56 ng/mL (ref <0.30)

## 2013-05-11 LAB — T4, FREE: FREE T4: 1.14 ng/dL (ref 0.80–1.80)

## 2013-05-11 MED ORDER — VALACYCLOVIR HCL 500 MG PO TABS
1000.0000 mg | ORAL_TABLET | Freq: Once | ORAL | Status: AC
  Administered 2013-05-11: 1000 mg via ORAL
  Filled 2013-05-11 (×2): qty 2

## 2013-05-11 NOTE — Progress Notes (Signed)
BP continues to run low despite HR normal since ablation.  Tele SR 80's, BP 86/49, 91/56.  Pt denies dizziness.  Amb to bathroom without difficulty, dark amber/orange urine. Pt states had not drank much due to being on bedrest and not wanting to use bedpan.  Encouraged pt to take po fluids.  Dr Elias Else informed, no orders received.

## 2013-05-11 NOTE — Op Note (Signed)
NAMEMARQUESHA, [name]              ACCOUNT NO.:  [id number]  MEDICAL RECORD NO.:  [id number]  LOCATION:  6C08C                        FACILITY:  [facility]  PHYSICIAN:  Champ Mungo. Lovena Le, MD    DATE OF BIRTH:  July 11, 1965  DATE OF PROCEDURE:  05/10/2013 DATE OF DISCHARGE:                              OPERATIVE REPORT   PROCEDURE PERFORMED:  Urgent electrophysiologic study and catheter ablation of AV node reentrant tachycardia.  INTRODUCTION:  The patient is a 48 year old woman who has had tachy palpitations for 40 years.  She has had documented SVT in the past. Over the years, she has learned to Valsalva or undergo vagal maneuvers to terminate her SVT.  She has had 3 episodes in the past where she could not get her SVTs stop requiring emergency room visits and intravenous adenosine.  She developed SVT beginning yesterday and for 14 hours had persistent SVT at rates of 170 beats per minute.  In our emergency room, Valsalva maneuvers, as well as vagal maneuvers, as well as intravenous adenosine and finally a DC cardioversion were all carried out and all were unsuccessful at terminating her SVT.  In reality, the SVT would terminate with these maneuvers and immediately start back.  As her SVT was incessant and associated with blood pressures in the 80s and severe symptoms, she is referred now for urgent catheter ablation.  DESCRIPTION OF PROCEDURE:  After informed consent was obtained, the patient was taken to the diagnostic EP lab in a fasting state.  After usual preparation and draping, intravenous fentanyl and midazolam was given for sedation.  When she came in to the lab, the SVT spontaneously stopped.  With catheter insertion, the SVT returned.  During SVT, the VA interval was short.  The cycle length was approximately 340 milliseconds.  The QRS complex was narrow on SVT.  With resumption of sinus rhythm, measurements of the basic intervals were carried out demonstrated an HV of 43 and a  QRS duration of 84 milliseconds.  Her baseline ECG did not show ventricular pre-excitation and the sinus node cycle length was around 700 milliseconds.  Rapid ventricular pacing was then carried out from the right ventricle and stepwise decreased down to 360 milliseconds where VA Wenckebach was observed.  During rapid ventricular pacing, the atrial activation sequence was midline and decremental.  Next, programmed ventricular stimulation was carried out from the right ventricle at a base drive cycle length of 500 milliseconds.  The S1-S2 interval was stepwise decreased from 440 milliseconds down to 240 milliseconds where ventricular refractoriness was observed.  During programmed ventricular stimulation, the atrial activation was again midline and decremental.  Next, programmed atrial stimulation was carried out from the atrium at a base drive cycle length of 500 milliseconds.  The S1-S2 interval was stepwise decreased down to 330 milliseconds where the AV node ERP was observed.  During programmed atrial stimulation, there were multiple AH jumps and double echo beats, but no inducible SVT.  During programmed atrial stimulation, the PR interval was greater than the RR interval.  Finally, rapid atrial pacing was carried out from the atrium at a pacing cycle length of 600 milliseconds, stepwise decreased down to 350 milliseconds resulting in  the initiation of SVT.  Once again, this was a narrow QRS tachycardia with a very short VA interval.  Mapping of the tachycardia demonstrated that the atrial activation was midline.  PVCs replaced at the time of His bundle refractoriness and did not pre-excite the atrium.  During tachycardia, the tachycardia was reproducibly terminated.  With all of the above diagnosis of AV node reentrant tachycardia was made and confirmed.  A 7-French quadripolar ablation catheter was then inserted percutaneously by way of the right femoral vein and advanced  under fluoroscopic guidance into the right atrium.  Mapping was carried out and Koch's triangle was unusually large.  The ablation catheter was then maneuvered in the sites 7 in Koch's triangle.  A total of 3 RF energy applications were delivered, and during RF energy application, there was accelerated junctional rhythm.  RF was applied between the tricuspid valve annulus all the way back to the region just above the coronary sinus.  There were a couple of retrograde block beats and RF was immediately terminated when this occurred.  Following ablation, additional rapid atrial pacing was carried out demonstrating no inducible SVT.  In addition, the AV Wenckebach cycle length following ablation was around 500 milliseconds.  The patient was observed for approximately 20 minutes following ablation, and with no inducibility, the catheters were removed and  hemostasis was assured and the patient was returned to her room in satisfactory condition.  COMPLICATIONS:  There were no immediate procedure complications.  RESULTS:  A.  Baseline ECG.  Baseline ECG demonstrates sinus rhythm with normal axis and intervals. B.  Baseline intervals.  Sinus node cycle length was 700 milliseconds. The HV interval was 43 milliseconds, and the QRS duration was 84 milliseconds.  The AH interval was 89 milliseconds.  Following catheter ablation, there was no significant change in the intervals. C.  Rapid ventricular pacing.  Rapid ventricular pacing was carried out from the right ventricle and stepwise decreased down to 360 milliseconds where VA Wenckebach was observed.  During rapid ventricular pacing, the atrial activation sequence was midline and decremental.  There was no inducible VT or SVT. D.  Programmed ventricular stimulation.  Programmed ventricular stimulation was carried out from the right ventricle at a base drive cycle length of 600 milliseconds.  The S1-S2 interval was stepwise decreased down to  240 milliseconds where ventricular refractoriness was observed.  During programmed ventricular stimulation, the atrial activation sequence again was midline and decremental and there was no inducible SVT. E.  Programmed atrial stimulation.  Programmed atrial stimulation was carried out from the atrium at a base drive cycle length of 500 milliseconds.  The S1-S2 interval was stepwise decreased from 440 milliseconds down to 330 milliseconds where the AV node ERP was observed.  During programmed atrial stimulation, there were multiple AH jumps and echo beats, but no inducible SVT. F.  Rapid atrial pacing.  Rapid atrial pacing was carried out from the atrium at a base drive cycle length of 600 milliseconds and stepwise decreased down to 350 milliseconds demonstrating induction of SVT. Following catheter ablation, rapid atrial pacing was carried out demonstrating an AV Wenckebach cycle length of 500 milliseconds. G.  Arrhythmias observed. 1. AV node reentrant tachycardia initiation was with rapid atrial     pacing, the duration was sustained.  The cycle length was 340     milliseconds and method of termination was with rapid ventricular     pacing.     a.     Mapping.  Mapping  of Koch's triangle demonstrated a fairly      large Koch's triangle fairly superiorly oriented.     b.     RF energy application.  A total of 3 RF energy applications      were delivered to sites 7 in Koch's triangle resulted in      accelerated junctional rhythm.  Following catheter ablation, there      was no inducible SVT.  CONCLUSION:  This study demonstrates successful electrophysiologic study and RF catheter ablation of incessant AV node reentrant tachycardia with 3 RF energy applications delivered to sites 7 in Koch's triangle. Following RF energy application, the patient was observed for over 20 minutes and had no inducible SVT despite very aggressive protocol of rapid atrial and ventricular pacing, programed  atrial and ventricular stimulation.     Champ Mungo. Lovena Le, MD     GWT/MEDQ  D:  05/10/2013  T:  05/11/2013  Job:  [id number]

## 2013-06-12 ENCOUNTER — Encounter: Payer: BC Managed Care – PPO | Admitting: Cardiology

## 2013-06-12 ENCOUNTER — Other Ambulatory Visit: Payer: Self-pay | Admitting: Internal Medicine

## 2013-06-15 ENCOUNTER — Ambulatory Visit (INDEPENDENT_AMBULATORY_CARE_PROVIDER_SITE_OTHER): Payer: BC Managed Care – PPO | Admitting: Internal Medicine

## 2013-06-15 ENCOUNTER — Ambulatory Visit (INDEPENDENT_AMBULATORY_CARE_PROVIDER_SITE_OTHER)
Admission: RE | Admit: 2013-06-15 | Discharge: 2013-06-15 | Disposition: A | Discharge status: Home / Self Care | Payer: BC Managed Care – PPO | Source: Ambulatory Visit | Attending: Internal Medicine | Admitting: Internal Medicine

## 2013-06-15 ENCOUNTER — Encounter: Payer: Self-pay | Admitting: Internal Medicine

## 2013-06-15 VITALS — BP 110/72 | HR 76 | Temp 98.9°F | Wt 238.0 lb

## 2013-06-15 DIAGNOSIS — M25562 Pain in left knee: Secondary | ICD-10-CM

## 2013-06-15 DIAGNOSIS — E039 Hypothyroidism, unspecified: Secondary | ICD-10-CM

## 2013-06-15 DIAGNOSIS — Z1239 Encounter for other screening for malignant neoplasm of breast: Secondary | ICD-10-CM

## 2013-06-15 DIAGNOSIS — B009 Herpesviral infection, unspecified: Secondary | ICD-10-CM

## 2013-06-15 DIAGNOSIS — E669 Obesity, unspecified: Secondary | ICD-10-CM

## 2013-06-15 DIAGNOSIS — M25569 Pain in unspecified knee: Secondary | ICD-10-CM

## 2013-06-15 DIAGNOSIS — B001 Herpesviral vesicular dermatitis: Secondary | ICD-10-CM

## 2013-06-15 DIAGNOSIS — Z0000 Encounter for general adult medical examination without abnormal findings: Secondary | ICD-10-CM

## 2013-06-15 MED ORDER — LEVOTHYROXINE SODIUM 150 MCG PO TABS: 150.0000 ug | ORAL_TABLET | Freq: Every day | ORAL | Status: DC

## 2013-06-15 MED ORDER — VALACYCLOVIR HCL 1 G PO TABS: 1000.0000 mg | ORAL_TABLET | Freq: Three times a day (TID) | ORAL | Status: DC

## 2013-06-15 NOTE — Progress Notes (Signed)
Pre-visit discussion using our clinic review tool. No additional management support is needed unless otherwise documented below in the visit note.  

## 2013-06-15 NOTE — Patient Instructions (Addendum)
It was good to see you today.  We have reviewed your prior records including labs and tests today  Health Maintenance reviewed - will refer for mammogram - all other recommended immunizations and age-appropriate screenings are up-to-date.  we'll make referral for mammogram screening and MRI of your knee. Our office will contact you regarding appointment(s) once made. If other referrals needed based on these results, you'll be contacted at that time  xray knee ordered today. Your results will be released to [place] (or called to you) after review, usually within 72hours after test completion. If any changes need to be made, you will be notified at that same time.  Medications reviewed and updated Valtrex as directed for cold sores, no other changes recommended at this time. Your prescription(s) have been submitted to your pharmacy. Please take as directed and contact our office if you believe you are having problem(s) with the medication(s).  Please schedule followup in 6 months for semiannual exam and labs, call sooner if problems.  Health Maintenance, Female A healthy lifestyle and preventative care can promote health and wellness.  Maintain regular health, dental, and eye exams.  Eat a healthy diet. Foods like vegetables, fruits, whole grains, low-fat dairy products, and lean protein foods contain the nutrients you need without too many calories. Decrease your intake of foods high in solid fats, added sugars, and salt. Get information about a proper diet from your caregiver, if necessary.  Regular physical exercise is one of the most important things you can do for your health. Most adults should get at least 150 minutes of moderate-intensity exercise (any activity that increases your heart rate and causes you to sweat) each week. In addition, most adults need muscle-strengthening exercises on 2 or more days a week.   Maintain a healthy weight. The body mass index (BMI) is a screening  tool to identify possible weight problems. It provides an estimate of body fat based on height and weight. Your caregiver can help determine your BMI, and can help you achieve or maintain a healthy weight. For adults 20 years and older:  A BMI below 18.5 is considered underweight.  A BMI of 18.5 to 24.9 is normal.  A BMI of 25 to 29.9 is considered overweight.  A BMI of 30 and above is considered obese.  Maintain normal blood lipids and cholesterol by exercising and minimizing your intake of saturated fat. Eat a balanced diet with plenty of fruits and vegetables. Blood tests for lipids and cholesterol should begin at age 33 and be repeated every 5 years. If your lipid or cholesterol levels are high, you are over 50, or you are a high risk for heart disease, you may need your cholesterol levels checked more frequently.Ongoing high lipid and cholesterol levels should be treated with medicines if diet and exercise are not effective.  If you smoke, find out from your caregiver how to quit. If you do not use tobacco, do not start.  Lung cancer screening is recommended for adults aged 54 80 years who are at high risk for developing lung cancer because of a history of smoking. Yearly low-dose computed tomography (CT) is recommended for people who have at least a 30-pack-year history of smoking and are a current smoker or have quit within the past 15 years. A pack year of smoking is smoking an average of 1 pack of cigarettes a day for 1 year (for example: 1 pack a day for 30 years or 2 packs a day for 15 years).  Yearly screening should continue until the smoker has stopped smoking for at least 15 years. Yearly screening should also be stopped for people who develop a health problem that would prevent them from having lung cancer treatment.  If you are pregnant, do not drink alcohol. If you are breastfeeding, be very cautious about drinking alcohol. If you are not pregnant and choose to drink alcohol, do not  exceed 1 drink per day. One drink is considered to be 12 ounces (355 mL) of beer, 5 ounces (148 mL) of wine, or 1.5 ounces (44 mL) of liquor.  Avoid use of street drugs. Do not share needles with anyone. Ask for help if you need support or instructions about stopping the use of drugs.  High blood pressure causes heart disease and increases the risk of stroke. Blood pressure should be checked at least every 1 to 2 years. Ongoing high blood pressure should be treated with medicines, if weight loss and exercise are not effective.  If you are 76 to 50 years old, ask your caregiver if you should take aspirin to prevent strokes.  Diabetes screening involves taking a blood sample to check your fasting blood sugar level. This should be done once every 3 years, after age 88, if you are within normal weight and without risk factors for diabetes. Testing should be considered at a younger age or be carried out more frequently if you are overweight and have at least 1 risk factor for diabetes.  Breast cancer screening is essential preventative care for women. You should practice "breast self-awareness." This means understanding the normal appearance and feel of your breasts and may include breast self-examination. Any changes detected, no matter how small, should be reported to a caregiver. Women in their 34s and 30s should have a clinical breast exam (CBE) by a caregiver as part of a regular health exam every 1 to 3 years. After age 40, women should have a CBE every year. Starting at age 76, women should consider having a mammogram (breast X-ray) every year. Women who have a family history of breast cancer should talk to their caregiver about genetic screening. Women at a high risk of breast cancer should talk to their caregiver about having an MRI and a mammogram every year.  Breast cancer gene (BRCA)-related cancer risk assessment is recommended for women who have family members with BRCA-related cancers.  BRCA-related cancers include breast, ovarian, tubal, and peritoneal cancers. Having family members with these cancers may be associated with an increased risk for harmful changes (mutations) in the breast cancer genes BRCA1 and BRCA2. Results of the assessment will determine the need for genetic counseling and BRCA1 and BRCA2 testing.  The Pap test is a screening test for cervical cancer. Women should have a Pap test starting at age 13. Between ages 49 and 59, Pap tests should be repeated every 2 years. Beginning at age 55, you should have a Pap test every 3 years as long as the past 3 Pap tests have been normal. If you had a hysterectomy for a problem that was not cancer or a condition that could lead to cancer, then you no longer need Pap tests. If you are between ages 47 and 55, and you have had normal Pap tests going back 10 years, you no longer need Pap tests. If you have had past treatment for cervical cancer or a condition that could lead to cancer, you need Pap tests and screening for cancer for at least 20 years after your  treatment. If Pap tests have been discontinued, risk factors (such as a new sexual partner) need to be reassessed to determine if screening should be resumed. Some women have medical problems that increase the chance of getting cervical cancer. In these cases, your caregiver may recommend more frequent screening and Pap tests.  The human papillomavirus (HPV) test is an additional test that may be used for cervical cancer screening. The HPV test looks for the virus that can cause the cell changes on the cervix. The cells collected during the Pap test can be tested for HPV. The HPV test could be used to screen women aged 21 years and older, and should be used in women of any age who have unclear Pap test results. After the age of 36, women should have HPV testing at the same frequency as a Pap test.  Colorectal cancer can be detected and often prevented. Most routine colorectal cancer  screening begins at the age of 74 and continues through age 8. However, your caregiver may recommend screening at an earlier age if you have risk factors for colon cancer. On a yearly basis, your caregiver may provide home test kits to check for hidden blood in the stool. Use of a small camera at the end of a tube, to directly examine the colon (sigmoidoscopy or colonoscopy), can detect the earliest forms of colorectal cancer. Talk to your caregiver about this at age 16, when routine screening begins. Direct examination of the colon should be repeated every 5 to 10 years through age 43, unless early forms of pre-cancerous polyps or small growths are found.  Hepatitis C blood testing is recommended for all people born from [birth year] through 1967 and any individual with known risks for hepatitis C.  Practice safe sex. Use condoms and avoid high-risk sexual practices to reduce the spread of sexually transmitted infections (STIs). Sexually active women aged 32 and younger should be checked for Chlamydia, which is a common sexually transmitted infection. Older women with new or multiple partners should also be tested for Chlamydia. Testing for other STIs is recommended if you are sexually active and at increased risk.  Osteoporosis is a disease in which the bones lose minerals and strength with aging. This can result in serious bone fractures. The risk of osteoporosis can be identified using a bone density scan. Women ages 69 and over and women at risk for fractures or osteoporosis should discuss screening with their caregivers. Ask your caregiver whether you should be taking a calcium supplement or vitamin D to reduce the rate of osteoporosis.  Menopause can be associated with physical symptoms and risks. Hormone replacement therapy is available to decrease symptoms and risks. You should talk to your caregiver about whether hormone replacement therapy is right for you.  Use sunscreen. Apply sunscreen liberally and  repeatedly throughout the day. You should seek shade when your shadow is shorter than you. Protect yourself by wearing long sleeves, pants, a wide-brimmed hat, and sunglasses year round, whenever you are outdoors.  Notify your caregiver of new moles or changes in moles, especially if there is a change in shape or color. Also notify your caregiver if a mole is larger than the size of a pencil eraser.  Stay current with your immunizations. Document Released: 10/26/2010 Document Revised: 08/07/2012 Document Reviewed: 10/26/2010 [facility] Patient Information 2014 [place]. Knee Pain Knee pain can be a result of an injury or other medical conditions. Treatment will depend on the cause of your pain. HOME CARE  Only take medicine as told by your doctor.  Keep a healthy weight. Being overweight can make the knee hurt more.  Stretch before exercising or playing sports.  If there is constant knee pain, change the way you exercise. Ask your doctor for advice.  Make sure shoes fit well. Choose the right shoe for the sport or activity.  Protect your knees. Wear kneepads if needed.  Rest when you are tired. GET HELP RIGHT AWAY IF:   Your knee pain does not stop.  Your knee pain does not get better.  Your knee joint feels hot to the touch.  You have a fever. MAKE SURE YOU:   Understand these instructions.  Will watch this condition.  Will get help right away if you are not doing well or get worse. Document Released: 07/09/2008 Document Revised: 07/05/2011 Document Reviewed: 07/09/2008 [facility] Patient Information 2014 [place], [place].

## 2013-06-15 NOTE — Progress Notes (Signed)
Subjective:    Patient ID: [name], female    DOB: [birth year], 48 y.o.   MRN: [id number]  HPI  patient is here today for annual physical and labs  Also reviewed chronic medical issues and interval medical events: hypothyroid, recurrent cold sores  Also reports L knee pain x 5 days Precipitated by accidental fall on the ice 5 days ago Landed on left hip and knee Pain located medially, extreme with weightbearing Feels "unstable" but no other falls  Past Medical History  Diagnosis Date  . OBESITY   . EATING DISORDER, HX OF   . Hypothyroidism, postsurgical   . Thyroid cancer     a. Dx 1995->resection and xrt, now on levothyroxine.  . SVT (supraventricular tachycardia)     a. diagnosed at age 6 - has never seen cardiology.; s/p RFCA of AVNRT 05/10/2013 by Dr Lovena Le  . Anxiety   . Depression    Family History  Problem Relation Age of Onset  . Alzheimer's disease Mother     currently 28.  . Colon cancer Father 27    currently 70.  . Alzheimer's disease Maternal Grandmother   . Stomach cancer Neg Hx     History  Substance Use Topics  . Smoking status: Never Smoker   . 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 Alcohol Use: Yes     Comment: 05/10/2013 "maybe 1 beer twice/month"   Review of Systems  Constitutional: Negative for fever, fatigue and unexpected weight change.  Respiratory: Negative for cough, shortness of breath and wheezing.   Cardiovascular: Negative for chest pain, palpitations and leg swelling.  Gastrointestinal: Negative for nausea, abdominal pain and diarrhea.  Musculoskeletal: Positive for arthralgias (L knee - see HPI), gait problem and joint swelling. Negative for neck stiffness.  Neurological: Negative for dizziness, weakness, light-headedness and headaches.  Psychiatric/Behavioral: Negative for dysphoric mood. The patient is not nervous/anxious.   All other systems reviewed and are  negative.       Objective:   Physical Exam  BP 110/72  Pulse 76  Temp(Src) 98.9 F (37.2 C) (Oral)  Wt 238 lb (107.956 kg)  SpO2 97% Wt Readings from Last 3 Encounters:  06/15/13 238 lb (107.956 kg)  06/16/12 215 lb (97.523 kg)  04/13/12 218 lb 8 oz (99.111 kg)    Constitutional: She is obese, but appears well-developed and well-nourished. No distress.  HENT: Head: Normocephalic and atraumatic. Ears: B TMs ok, no erythema or effusion; Nose: Nose normal. Mouth/Throat: Oropharynx is clear and moist. No oropharyngeal exudate.  Eyes: Conjunctivae and EOM are normal. Pupils are equal, round, and reactive to light. No scleral icterus.  Neck: Normal range of motion. Neck supple. No JVD present. No thyromegaly present.  Cardiovascular: Normal rate, regular rhythm and normal heart sounds.  No murmur heard. No BLE edema. Pulmonary/Chest: Effort normal and breath sounds normal. No respiratory distress. She has no wheezes.  Abdominal: Soft. Bowel sounds are normal. She exhibits no distension. There is no tenderness. no masses Musculoskeletal: L knee: decreased range of motion due to swelling and pain. Effusion present. Stable to lateral and medial ligamentous testing. Positive anterior drawer and Lachman. Skin intact, neurovascularly intact distal. Neurological: She is alert and oriented to person, place, and time. No cranial nerve deficit. Coordination, balance, strength, speech and gait are normal.  Skin: cold sore on right mouth. Remaining skin is warm and dry. No rash noted. No erythema.  Psychiatric:  She has a normal mood and affect. Her behavior is normal. Judgment and thought content normal.      Lab Results  Component Value Date   WBC 11.1* 05/10/2013   HGB 12.9 05/10/2013   HCT 39.1 05/10/2013   PLT 228 05/10/2013   GLUCOSE 101* 05/11/2013   CHOL 134 05/11/2013   TRIG 125 05/11/2013   HDL 31* 05/11/2013   LDLCALC 78 05/11/2013   ALT 16 05/11/2013   AST 17 05/11/2013   NA 138  05/11/2013   K 3.9 05/11/2013   CL 106 05/11/2013   CREATININE 0.80 05/11/2013   BUN 17 05/11/2013   CO2 21 05/11/2013   TSH 5.770* 05/10/2013    No results found.     Assessment & Plan:   CPX/v70.0 - Patient has been counseled on age-appropriate routine health concerns for screening and prevention. These are reviewed and up-to-date. Immunizations are up-to-date or declined. Labs ordered and reviewed.  Recurrent cold sores. Prescribed Valtrex for same. Education reassurance provided  Left knee pain. Exam concerning for anterior cruciate ligament tear. History of same on right with similar injury. Check plain film now and refer for MRI. Orthopedics as needed based on results  Problem List Items Addressed This Visit   HYPOTHYROIDISM      S/p total thyroidectomy 1995 due to cancer Dose change 05/2011 due to abn TSH - Out of meds approx 1 week due to no refills - Resume now and re check tsh in 2-4 weeks - adjust as needed Lab Results  Component Value Date   TSH 5.770* 05/10/2013      Relevant Medications      levothyroxine (SYNTHROID, LEVOTHROID) tablet   OBESITY      Wt Readings from Last 3 Encounters:  06/15/13 238 lb (107.956 kg)  06/16/12 215 lb (97.523 kg)  04/13/12 218 lb 8 oz (99.111 kg)   The patient is asked to make a resumed attempt to improve diet and exercise patterns to aid in medical management of this problem.  Previous use of phentermine reviewed    Relevant Medications      amphetamine-dextroamphetamine (ADDERALL) 10 MG tablet    Other Visit Diagnoses   Routine general medical examination at a health care facility    -  Primary    Recurrent cold sores        Relevant Medications       valACYclovir (VALTREX) tablet    Left medial knee pain        Relevant Orders       DG Knee Complete 4 Views Left (Completed)       MR Knee Left  Wo Contrast    Other screening breast examination        Relevant Orders       MM DIGITAL SCREENING BILATERAL

## 2013-06-15 NOTE — Assessment & Plan Note (Signed)
S/p total thyroidectomy 1995 due to cancer Dose change 05/2011 due to abn TSH - Out of meds approx 1 week due to no refills - Resume now and re check tsh in 2-4 weeks - adjust as needed Lab Results  Component Value Date   TSH 5.770* 05/10/2013

## 2013-06-16 NOTE — Assessment & Plan Note (Signed)
Wt Readings from Last 3 Encounters:  06/15/13 238 lb (107.956 kg)  06/16/12 215 lb (97.523 kg)  04/13/12 218 lb 8 oz (99.111 kg)   The patient is asked to make a resumed attempt to improve diet and exercise patterns to aid in medical management of this problem.  Previous use of phentermine reviewed

## 2013-06-22 ENCOUNTER — Ambulatory Visit
Admission: RE | Admit: 2013-06-22 | Discharge: 2013-06-22 | Disposition: A | Discharge status: Home / Self Care | Payer: BC Managed Care – PPO | Source: Ambulatory Visit | Attending: Internal Medicine | Admitting: Internal Medicine

## 2013-06-22 DIAGNOSIS — M25562 Pain in left knee: Secondary | ICD-10-CM

## 2013-07-10 ENCOUNTER — Encounter: Payer: Self-pay | Admitting: Internal Medicine

## 2013-07-10 DIAGNOSIS — S83512A Sprain of anterior cruciate ligament of left knee, initial encounter: Secondary | ICD-10-CM

## 2013-07-25 DIAGNOSIS — S83519A Sprain of anterior cruciate ligament of unspecified knee, initial encounter: Secondary | ICD-10-CM | POA: Insufficient documentation

## 2013-07-25 DIAGNOSIS — M25569 Pain in unspecified knee: Secondary | ICD-10-CM | POA: Insufficient documentation

## 2013-08-16 DIAGNOSIS — Z9889 Other specified postprocedural states: Secondary | ICD-10-CM | POA: Insufficient documentation

## 2013-12-05 ENCOUNTER — Other Ambulatory Visit: Payer: Self-pay | Admitting: Obstetrics & Gynecology

## 2013-12-05 DIAGNOSIS — N920 Excessive and frequent menstruation with regular cycle: Secondary | ICD-10-CM

## 2013-12-05 DIAGNOSIS — N83202 Unspecified ovarian cyst, left side: Secondary | ICD-10-CM

## 2013-12-07 ENCOUNTER — Encounter: Payer: Self-pay | Admitting: Gynecology

## 2013-12-07 ENCOUNTER — Ambulatory Visit: Payer: BC Managed Care – PPO | Attending: Gynecology | Admitting: Gynecology

## 2013-12-07 VITALS — BP 116/67 | HR 82 | Temp 98.6°F | Resp 20 | Ht 62.0 in | Wt 236.4 lb

## 2013-12-07 DIAGNOSIS — E890 Postprocedural hypothyroidism: Secondary | ICD-10-CM | POA: Diagnosis not present

## 2013-12-07 DIAGNOSIS — Z7982 Long term (current) use of aspirin: Secondary | ICD-10-CM | POA: Insufficient documentation

## 2013-12-07 DIAGNOSIS — Z85850 Personal history of malignant neoplasm of thyroid: Secondary | ICD-10-CM | POA: Insufficient documentation

## 2013-12-07 DIAGNOSIS — E669 Obesity, unspecified: Secondary | ICD-10-CM | POA: Insufficient documentation

## 2013-12-07 DIAGNOSIS — N839 Noninflammatory disorder of ovary, fallopian tube and broad ligament, unspecified: Secondary | ICD-10-CM | POA: Insufficient documentation

## 2013-12-07 DIAGNOSIS — F411 Generalized anxiety disorder: Secondary | ICD-10-CM | POA: Insufficient documentation

## 2013-12-07 DIAGNOSIS — F329 Major depressive disorder, single episode, unspecified: Secondary | ICD-10-CM | POA: Diagnosis not present

## 2013-12-07 DIAGNOSIS — F3289 Other specified depressive episodes: Secondary | ICD-10-CM | POA: Insufficient documentation

## 2013-12-07 DIAGNOSIS — Z79899 Other long term (current) drug therapy: Secondary | ICD-10-CM | POA: Insufficient documentation

## 2013-12-07 DIAGNOSIS — N838 Other noninflammatory disorders of ovary, fallopian tube and broad ligament: Secondary | ICD-10-CM

## 2013-12-07 NOTE — Patient Instructions (Signed)
We will contact you with a date and time for you upcoming surgery.

## 2013-12-07 NOTE — Progress Notes (Signed)
Consult Note: Gyn-Onc   Gasper Lloyd 48 y.o. female  Chief Complaint  Patient presents with  . Ovarian Mass    New patient    Assessment : A 6 cm complex left adnexal mass with minimally elevated OV1 panel and normal CA 125.  Plan: I recommend that we proceed with laparoscopic left salpingo-oophorectomy removing the ovarian mass intact using an Endo Catch bag and obtained intraoperative frozen section. The patient is clearly indicated that she would prefer to proceed under the same anesthesia with surgical staging if ovarian cancer were discovered. This would include a total abdominal hysterectomy, bilateral salpingo-oophorectomy, omentectomy, peritoneal biopsies and pelvic and para-aortic lymphadenectomy. The risks of both procedures were discussed as well as the duration hospitalization and subsequent care which might include chemotherapy.  The OR schedule in [place] is very full for the remainder of the month and therefore we will add her to the operating room scheduled at [facility] for 12/21/2013.   HPI: 48 year old white female seen in consultation at the request of Dr. Dellis Filbert regarding management of a newly diagnosed 6 point 0 x 5.6 x 3.9 cm left adnexal cystic mass. Within the mass there is an echogenic nodule measuring 2.5 x 2.4 x 2.5 cm. The patient admits to having some pain and pressure in the lower abdomen for several weeks. She notes that she has regular cyclic menses although for the last 5 months her periods have been heavier than usual. She denies any intermenstrual bleeding or spotting. As part of the workup the mass CA 125 was obtained (24 units per mL) and OVA1 was 5.1 (normal in a premenopausal woman is less than 5). She denies any other gynecologic history. Obstetrical history gravida 2  An MRI has been scheduled for August 20.    Review of Systems:10 point review of systems is negative except as noted in interval history.   Vitals: Blood pressure 116/67, pulse 82,  temperature 98.6 F (37 C), temperature source Oral, resp. rate 20, height 5\' 2"  (1.575 m), weight 236 lb 6.4 oz (107.23 kg).  Physical Exam: General : The patient is a healthy woman in no acute distress.  HEENT: normocephalic, extraoccular movements normal; neck is supple without thyromegally  Lynphnodes: Supraclavicular and inguinal nodes not enlarged  Abdomen: Soft, non-tender, no ascites, no organomegally, no masses, no hernias  Pelvic:  EGBUS: Normal female  Vagina: Normal, no lesions  Urethra and Bladder: Normal, non-tender  Cervix: Normal  Uterus: Anterior normal shape size and consistency Bi-manual examination: Non-tender; no adenxal masses or nodularity (I am unable to detect the mass) Rectal: normal sphincter tone, no masses, no blood  Lower extremities: No edema or varicosities. Normal range of motion      No Known Allergies  Past Medical History  Diagnosis Date  . OBESITY   . EATING DISORDER, HX OF   . Hypothyroidism, postsurgical   . Thyroid cancer     a. Dx 1995->resection and xrt, now on levothyroxine.  . SVT (supraventricular tachycardia)     a. diagnosed at age 29 - has never seen cardiology.; s/p RFCA of AVNRT 05/10/2013 by Dr Lovena Le  . Anxiety   . Depression     Past Surgical History  Procedure Laterality Date  . Total thyroidectomy  1995  . Cesarean section  1986, 1996  . Knee arthroscopy w/ acl reconstruction Right 2012    a collins  . Lipoma excision Right 1990's X2    neck  . Ablation  05/10/2013    RFCA  of AVNRT by Dr Lovena Le  . Appendectomy  2004  . Tubal ligation  1996  . Cardiac valve surgery  04/2013    Current Outpatient Prescriptions  Medication Sig Dispense Refill  . amphetamine-dextroamphetamine (ADDERALL) 10 MG tablet Take 1 by mouth twice a day      . escitalopram (LEXAPRO) 20 MG tablet Take 20 mg by mouth daily.      Marland Kitchen levothyroxine (SYNTHROID, LEVOTHROID) 150 MCG tablet Take 1 tablet (150 mcg total) by mouth daily.  90 tablet  1   . Aspirin-Acetaminophen-Caffeine (GOODY HEADACHE PO) Take 1 Package by mouth daily as needed (for pain).      . valACYclovir (VALTREX) 1000 MG tablet Take 1 tablet (1,000 mg total) by mouth 3 (three) times daily.  21 tablet  1   No current facility-administered medications for this visit.    History   Social History  . Marital Status: Married    Spouse Name: N/A    Number of Children: N/A  . Years of Education: N/A   Occupational History  . Not on file.   Social History Main Topics  . Smoking status: Never Smoker   . 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 Alcohol Use: Yes     Comment: 05/10/2013 "maybe 1 beer twice/month"  . Drug Use: No  . Sexual Activity: Yes   Other Topics Concern  . Not on file   Social History Narrative   Lives in [place] with husband and son.  Chemical engineer.    Family History  Problem Relation Age of Onset  . Alzheimer's disease Mother     currently 49.  . Colon cancer Father 75    currently 67.  . Lung cancer Father   . Alzheimer's disease Maternal Grandmother   . Stomach cancer Neg Hx   . Colon cancer Paternal Grandmother   . Colon cancer Paternal Lindajo Royal, MD 12/07/2013, 2:59 PM

## 2013-12-13 ENCOUNTER — Ambulatory Visit
Admission: RE | Admit: 2013-12-13 | Discharge: 2013-12-13 | Disposition: A | Discharge status: Home / Self Care | Payer: BC Managed Care – PPO | Source: Ambulatory Visit | Attending: Obstetrics & Gynecology | Admitting: Obstetrics & Gynecology

## 2013-12-13 DIAGNOSIS — N83202 Unspecified ovarian cyst, left side: Secondary | ICD-10-CM

## 2013-12-13 DIAGNOSIS — N920 Excessive and frequent menstruation with regular cycle: Secondary | ICD-10-CM

## 2013-12-13 MED ORDER — GADOBENATE DIMEGLUMINE 529 MG/ML IV SOLN
20.0000 mL | Freq: Once | INTRAVENOUS | Status: AC | PRN
  Administered 2013-12-13: 20 mL via INTRAVENOUS

## 2013-12-19 ENCOUNTER — Other Ambulatory Visit: Payer: Self-pay | Admitting: Internal Medicine

## 2013-12-21 DIAGNOSIS — Z9889 Other specified postprocedural states: Secondary | ICD-10-CM | POA: Insufficient documentation

## 2013-12-21 DIAGNOSIS — R1900 Intra-abdominal and pelvic swelling, mass and lump, unspecified site: Secondary | ICD-10-CM | POA: Insufficient documentation

## 2013-12-23 ENCOUNTER — Other Ambulatory Visit: Payer: Self-pay | Admitting: Internal Medicine

## 2014-01-18 DIAGNOSIS — M25511 Pain in right shoulder: Secondary | ICD-10-CM | POA: Insufficient documentation

## 2014-01-25 ENCOUNTER — Other Ambulatory Visit: Payer: BC Managed Care – PPO

## 2014-01-25 ENCOUNTER — Ambulatory Visit (INDEPENDENT_AMBULATORY_CARE_PROVIDER_SITE_OTHER): Payer: BC Managed Care – PPO | Admitting: Internal Medicine

## 2014-01-25 ENCOUNTER — Encounter: Payer: Self-pay | Admitting: Internal Medicine

## 2014-01-25 VITALS — BP 104/70 | HR 86 | Temp 98.2°F | Ht 62.0 in | Wt 229.0 lb

## 2014-01-25 DIAGNOSIS — R1032 Left lower quadrant pain: Secondary | ICD-10-CM | POA: Insufficient documentation

## 2014-01-25 LAB — POCT URINALYSIS DIPSTICK
BILIRUBIN UA: NEGATIVE
Glucose, UA: NEGATIVE
Ketones, UA: 7
LEUKOCYTES UA: NEGATIVE
NITRITE UA: NEGATIVE
PH UA: 7
Protein, UA: NEGATIVE
Spec Grav, UA: 1.015
UROBILINOGEN UA: 0.2

## 2014-01-25 MED ORDER — NAPROXEN 500 MG PO TABS: 500.0000 mg | ORAL_TABLET | Freq: Two times a day (BID) | ORAL | Status: DC

## 2014-01-25 NOTE — Patient Instructions (Addendum)
Please continue all other medications as before, and refills have been done if requested.  Please have the pharmacy call with any other refills you may need.  Please keep your appointments with your specialists as you may have planned  You can also take naproxen (anti-inflammatory) for pain  You will be contacted regarding the referral for: pelvic ultrasound  If the pain persists or worsen, you may wish to see your GYN as well

## 2014-01-25 NOTE — Progress Notes (Signed)
Pre visit review using our clinic review tool, if applicable. No additional management support is needed unless otherwise documented below in the visit note. 

## 2014-01-25 NOTE — Progress Notes (Signed)
Subjective:    Patient ID: [name], female    DOB: 1966/04/12, 48 y.o.   MRN: [id number]  HPI  Here with 3 wks onset knifelike left lower abd pain, varies - mostly mod but sometimes severe, intermittent, sometimes tender to palpate the area, no radiation, no n/v, fever, blood, but ? Constipated recently with diet change, trying to lose wt.  Denies urinary symptoms such as dysuria, frequency, urgency, flank pain, hematuria or n/v, fever, chills.  No LBP, or hip pain.  Is s/p laparoscopic TAH and left oophorectomy (right ovary intact) Dec 21, 2013 (done for left dermoid cyst by pathology, malignancy ruled out with surgury). Took pain med post surgury only one day.   Udip in office - neg for acute Past Medical History  Diagnosis Date  . OBESITY   . EATING DISORDER, HX OF   . Hypothyroidism, postsurgical   . Thyroid cancer     a. Dx 1995->resection and xrt, now on levothyroxine.  . SVT (supraventricular tachycardia)     a. diagnosed at age 40 - has never seen cardiology.; s/p RFCA of AVNRT 05/10/2013 by Dr Lovena Le  . Anxiety   . Depression    Past Surgical History  Procedure Laterality Date  . Total thyroidectomy  1995  . Cesarean section  1986, 1996  . Knee arthroscopy w/ acl reconstruction Right 2012    a collins  . Lipoma excision Right 1990's X2    neck  . Ablation  05/10/2013    RFCA of AVNRT by Dr Lovena Le  . Appendectomy  2004  . Tubal ligation  1996  . Cardiac valve surgery  04/2013    reports that she has never smoked. She has never used smokeless tobacco. She reports that she drinks alcohol. She reports that she does not use illicit drugs. family history includes Alzheimer's disease in her maternal grandmother and mother; Colon cancer in her paternal grandfather and paternal grandmother; Colon cancer (age of onset: 57) in her father; Lung cancer in her father. There is no history of Stomach cancer. No Known Allergies Current Outpatient Prescriptions on File Prior to  Visit  Medication Sig Dispense Refill  . amphetamine-dextroamphetamine (ADDERALL) 10 MG tablet Take 1 by mouth twice a day      . Aspirin-Acetaminophen-Caffeine (GOODY HEADACHE PO) Take 1 Package by mouth daily as needed (for pain).      Marland Kitchen escitalopram (LEXAPRO) 20 MG tablet Take 20 mg by mouth daily.      Marland Kitchen levothyroxine (SYNTHROID, LEVOTHROID) 150 MCG tablet TAKE 1 TABLET (150 MCG TOTAL) BY MOUTH DAILY.  90 tablet  1  . valACYclovir (VALTREX) 1000 MG tablet TAKE 1 TABLET (1,000 MG TOTAL) BY MOUTH 3 (THREE) TIMES DAILY.  21 tablet  1   No current facility-administered medications on file prior to visit.   Review of Systems  Constitutional: Negative for unusual diaphoresis or other sweats  HENT: Negative for ringing in ear Eyes: Negative for double vision or worsening visual disturbance.  Respiratory: Negative for choking and stridor.   Gastrointestinal: Negative for vomiting or other signifcant bowel change Genitourinary: Negative for hematuria or decreased urine volume.  Musculoskeletal: Negative for other MSK pain or swelling Skin: Negative for color change and worsening wound.  Neurological: Negative for tremors and numbness other than noted  Psychiatric/Behavioral: Negative for decreased concentration or agitation other than above       Objective:   Physical Exam BP 104/70  Pulse 86  Temp(Src) 98.2 F (36.8 C) (Oral)  Ht 5\' 2"  (1.575 m)  Wt 229 lb (103.874 kg)  BMI 41.87 kg/m2  SpO2 95% VS noted,  Constitutional: Pt appears well-developed, well-nourished.  HENT: Head: NCAT.  Right Ear: External ear normal.  Left Ear: External ear normal.  Eyes: . Pupils are equal, round, and reactive to light. Conjunctivae and EOM are normal Neck: Normal range of motion. Neck supple.  Cardiovascular: Normal rate and regular rhythm.   Pulmonary/Chest: Effort normal and breath sounds normal.  Abd:  Soft, NT, ND, + BS Neurological: Pt is alert. Not confused , motor grossly intact Skin:  Skin is warm. No rash Psychiatric: Pt behavior is normal. No agitation.     Assessment & Plan:

## 2014-01-27 NOTE — Assessment & Plan Note (Signed)
Etiology unclear, ? MSK for pelvic etiology post op such as seroma? Udip neg, exam benign, no GI symptoms, for pelvic u/s, naproxen prn,  to f/u any worsening symptoms or concerns, may need f/u with GYN as well

## 2014-01-29 ENCOUNTER — Ambulatory Visit: Payer: BC Managed Care – PPO | Admitting: Internal Medicine

## 2014-02-20 DIAGNOSIS — D279 Benign neoplasm of unspecified ovary: Secondary | ICD-10-CM | POA: Insufficient documentation

## 2014-03-22 ENCOUNTER — Other Ambulatory Visit: Payer: Self-pay | Admitting: Internal Medicine

## 2014-04-04 ENCOUNTER — Encounter (HOSPITAL_COMMUNITY): Payer: Self-pay | Admitting: Internal Medicine

## 2014-06-21 ENCOUNTER — Other Ambulatory Visit: Payer: Self-pay | Admitting: Internal Medicine

## 2014-08-15 ENCOUNTER — Encounter: Payer: Self-pay | Admitting: Internal Medicine

## 2014-08-15 ENCOUNTER — Other Ambulatory Visit: Payer: BLUE CROSS/BLUE SHIELD

## 2014-08-15 ENCOUNTER — Ambulatory Visit (INDEPENDENT_AMBULATORY_CARE_PROVIDER_SITE_OTHER): Payer: BLUE CROSS/BLUE SHIELD | Admitting: Internal Medicine

## 2014-08-15 VITALS — BP 118/80 | HR 83 | Temp 98.3°F | Ht 62.0 in | Wt 242.1 lb

## 2014-08-15 DIAGNOSIS — R300 Dysuria: Secondary | ICD-10-CM

## 2014-08-15 DIAGNOSIS — R3915 Urgency of urination: Secondary | ICD-10-CM | POA: Diagnosis not present

## 2014-08-15 LAB — POCT URINALYSIS DIPSTICK
BILIRUBIN UA: NEGATIVE
Blood, UA: NEGATIVE
Glucose, UA: NEGATIVE
Ketones, UA: NEGATIVE
LEUKOCYTES UA: NEGATIVE
Nitrite, UA: NEGATIVE
PH UA: 6
Protein, UA: NEGATIVE
Spec Grav, UA: 1.015
Urobilinogen, UA: 4

## 2014-08-15 MED ORDER — NITROFURANTOIN MACROCRYSTAL 100 MG PO CAPS: 100.0000 mg | ORAL_CAPSULE | Freq: Two times a day (BID) | ORAL | Status: DC

## 2014-08-15 NOTE — Progress Notes (Signed)
   Subjective:    Patient ID: [name], female    DOB: Aug 03, 1965, 49 y.o.   MRN: [id number]  HPI The patient is a 49 YO female who is coming in with concern of UTI. She has had increased frequency and urgency. She has had several accidents in the last week due to being unable to make it to the restroom. This is normally not a problem. She does have rare incontinence but not this common. She denies fevers, chills, abdominal pain, dysuria. No other recent changes in medication. Has seen urology in the past without findings. Leaking worse with sneezing.  Review of Systems  Constitutional: Negative for fever, activity change, appetite change, fatigue and unexpected weight change.  Gastrointestinal: Negative.   Genitourinary: Positive for urgency and frequency. Negative for flank pain, decreased urine volume, enuresis and pelvic pain.       Incontinence  Musculoskeletal: Negative.       Objective:   Physical Exam  Constitutional: She is oriented to person, place, and time. She appears well-developed and well-nourished.  Cardiovascular: Normal rate.   Pulmonary/Chest: Effort normal.  Abdominal: Soft. She exhibits no distension. There is no tenderness. There is no rebound.  Musculoskeletal:  No flank tenderness  Neurological: She is alert and oriented to person, place, and time. Coordination normal.   Filed Vitals:   08/15/14 1515  BP: 118/80  Pulse: 83  Temp: 98.3 F (36.8 C)  TempSrc: Oral  Height: 5\' 2"  (1.575 m)  Weight: 242 lb 2 oz (109.827 kg)  SpO2: 95%      Assessment & Plan:

## 2014-08-15 NOTE — Progress Notes (Signed)
Pre visit review using our clinic review tool, if applicable. No additional management support is needed unless otherwise documented below in the visit note. 

## 2014-08-15 NOTE — Assessment & Plan Note (Signed)
Although she does not have signs of UTI in the dipstick will send for culture. Treat with nitrofurantoin for 10 days given acute onset of symptoms. If no improvement with treatment is likely progression of stress incontince (possibly mixed given large volume leaking) and will trial antispasmodic to see if this helps. If no improvement will need to return to urology.

## 2014-08-15 NOTE — Patient Instructions (Signed)
We have sent in nitrofurantoin for the urinary symptoms. Take 1 pill twice a day for 10 days. If by Tuesday or Wednesday you are not doing better call us and we will call in a bladder spasm.

## 2014-08-16 LAB — URINE CULTURE: Colony Count: 50000

## 2014-10-21 ENCOUNTER — Ambulatory Visit (INDEPENDENT_AMBULATORY_CARE_PROVIDER_SITE_OTHER): Payer: BLUE CROSS/BLUE SHIELD | Admitting: Internal Medicine

## 2014-10-21 ENCOUNTER — Encounter: Payer: Self-pay | Admitting: Internal Medicine

## 2014-10-21 VITALS — BP 108/78 | HR 87 | Temp 97.7°F | Resp 16 | Ht 62.0 in | Wt 228.8 lb

## 2014-10-21 DIAGNOSIS — R21 Rash and other nonspecific skin eruption: Secondary | ICD-10-CM

## 2014-10-21 MED ORDER — NYSTATIN 100000 UNIT/GM EX CREA: 1.0000 "application " | TOPICAL_CREAM | Freq: Two times a day (BID) | CUTANEOUS | Status: DC

## 2014-10-21 MED ORDER — VALACYCLOVIR HCL 1 G PO TABS: 1000.0000 mg | ORAL_TABLET | Freq: Three times a day (TID) | ORAL | Status: DC

## 2014-10-21 MED ORDER — FLUCONAZOLE 150 MG PO TABS: 150.0000 mg | ORAL_TABLET | Freq: Once | ORAL | Status: DC

## 2014-10-21 NOTE — Progress Notes (Signed)
Pre visit review using our clinic review tool, if applicable. No additional management support is needed unless otherwise documented below in the visit note. 

## 2014-10-21 NOTE — Patient Instructions (Signed)
We have sent in a pill to help jumpstart the rash to go away. Take 1 pill today and take another pill on Wednesday. You can also use the cream twice a day until the rash is gone.   Once the rash is gone you can use the cream 2-3 times a week to help keep it from coming back. Another option is to use cornstarch to keep the moisture away from the skin on highly moist areas in this heat.   Call us if the rash is not gone in 1-2 weeks.

## 2014-10-21 NOTE — Progress Notes (Signed)
   Subjective:    Patient ID: [name], female    DOB: 05-Aug-1965, 49 y.o.   MRN: [id number]  HPI The patient is coming in for new problem of rash on her stomach. Started about 1-2 weeks ago. Has spread and she has been trying OTC steroid creams which help some but the rash comes back. No fevers or chills. No SOB. No new soaps or detergent known.    Review of Systems  Constitutional: Negative for fever, activity change, appetite change, fatigue and unexpected weight change.  Respiratory: Negative.   Cardiovascular: Negative.   Gastrointestinal: Negative.   Skin: Positive for rash.      Objective:   Physical Exam  Constitutional: She appears well-developed and well-nourished.  Overweight  HENT:  Head: Normocephalic and atraumatic.  Eyes: EOM are normal.  Neck: Normal range of motion.  Cardiovascular: Normal rate and regular rhythm.   Pulmonary/Chest: Effort normal.  Abdominal: Soft. She exhibits no distension. There is no tenderness.  Skin: Skin is warm and dry.  Red rash with satellite lesions on the stomach and under the breasts.    Filed Vitals:   10/21/14 1535  BP: 108/78  Pulse: 87  Temp: 97.7 F (36.5 C)  TempSrc: Oral  Resp: 16  Height: 5\' 2"  (1.575 m)  Weight: 228 lb 12.8 oz (103.783 kg)  SpO2: 97%      Assessment & Plan:

## 2014-10-23 DIAGNOSIS — R21 Rash and other nonspecific skin eruption: Secondary | ICD-10-CM | POA: Insufficient documentation

## 2014-10-23 NOTE — Assessment & Plan Note (Signed)
Appears to be yeast on the skin. Will give fluconazole oral as well as anti-fungal cream. Talked to her about clothing to wick moisture. Also talked to her about cornstarch to help prevent moisture and heat from sitting as that is an environment they can thrive.

## 2014-12-26 ENCOUNTER — Other Ambulatory Visit: Payer: Self-pay | Admitting: Internal Medicine

## 2015-04-09 ENCOUNTER — Other Ambulatory Visit: Payer: Self-pay | Admitting: Internal Medicine

## 2015-04-24 ENCOUNTER — Other Ambulatory Visit (INDEPENDENT_AMBULATORY_CARE_PROVIDER_SITE_OTHER): Payer: BLUE CROSS/BLUE SHIELD

## 2015-04-24 ENCOUNTER — Ambulatory Visit (INDEPENDENT_AMBULATORY_CARE_PROVIDER_SITE_OTHER): Payer: BLUE CROSS/BLUE SHIELD | Admitting: Internal Medicine

## 2015-04-24 ENCOUNTER — Encounter: Payer: Self-pay | Admitting: Internal Medicine

## 2015-04-24 VITALS — BP 110/70 | HR 89 | Temp 98.0°F | Resp 12 | Ht 62.0 in | Wt 227.4 lb

## 2015-04-24 DIAGNOSIS — Z0000 Encounter for general adult medical examination without abnormal findings: Secondary | ICD-10-CM

## 2015-04-24 DIAGNOSIS — Z23 Encounter for immunization: Secondary | ICD-10-CM | POA: Diagnosis not present

## 2015-04-24 DIAGNOSIS — E039 Hypothyroidism, unspecified: Secondary | ICD-10-CM

## 2015-04-24 LAB — COMPREHENSIVE METABOLIC PANEL
ALT: 13 U/L (ref 0–35)
AST: 14 U/L (ref 0–37)
Albumin: 4 g/dL (ref 3.5–5.2)
Alkaline Phosphatase: 78 U/L (ref 39–117)
BILIRUBIN TOTAL: 0.5 mg/dL (ref 0.2–1.2)
BUN: 11 mg/dL (ref 6–23)
CALCIUM: 9.2 mg/dL (ref 8.4–10.5)
CHLORIDE: 102 meq/L (ref 96–112)
CO2: 26 mEq/L (ref 19–32)
Creatinine, Ser: 0.65 mg/dL (ref 0.40–1.20)
GFR: 101.89 mL/min (ref >60.00)
Glucose, Bld: 90 mg/dL (ref 70–99)
POTASSIUM: 4 meq/L (ref 3.5–5.1)
Sodium: 136 mEq/L (ref 135–145)
Total Protein: 7.3 g/dL (ref 6.0–8.3)

## 2015-04-24 LAB — LIPID PANEL
Cholesterol: 164 mg/dL (ref 0–200)
HDL: 43.2 mg/dL (ref >39.00)
LDL CALC: 101 mg/dL — AB (ref 0–99)
NONHDL: 121.12
TRIGLYCERIDES: 99 mg/dL (ref 0.0–149.0)
Total CHOL/HDL Ratio: 4
VLDL: 19.8 mg/dL (ref 0.0–40.0)

## 2015-04-24 LAB — CBC
HCT: 45.3 % (ref 36.0–46.0)
Hemoglobin: 14.9 g/dL (ref 12.0–15.0)
MCHC: 33 g/dL (ref 30.0–36.0)
MCV: 89.9 fl (ref 78.0–100.0)
Platelets: 285 10*3/uL (ref 150.0–400.0)
RBC: 5.03 Mil/uL (ref 3.87–5.11)
RDW: 12.8 % (ref 11.5–15.5)
WBC: 8.7 10*3/uL (ref 4.0–10.5)

## 2015-04-24 LAB — T4, FREE: Free T4: 1.26 ng/dL (ref 0.60–1.60)

## 2015-04-24 LAB — HEMOGLOBIN A1C: Hgb A1c MFr Bld: 5.3 % (ref 4.6–6.5)

## 2015-04-24 LAB — TSH: TSH: 0.24 u[IU]/mL — ABNORMAL LOW (ref 0.35–4.50)

## 2015-04-24 MED ORDER — VALACYCLOVIR HCL 1 G PO TABS: 1000.0000 mg | ORAL_TABLET | Freq: Three times a day (TID) | ORAL | Status: DC

## 2015-04-24 NOTE — Assessment & Plan Note (Signed)
Checking TSH and free T4 and adjust as needed. Currently on synthroid 150 mcg daily.

## 2015-04-24 NOTE — Assessment & Plan Note (Signed)
Given flu shot at visit. Due for colonoscopy next year, mammogram up to date. Checking labs today, adjust as needed.

## 2015-04-24 NOTE — Assessment & Plan Note (Signed)
She is working on exercising and has been doing well before the winter came. She will work on finding some exercise to do in the winter.

## 2015-04-24 NOTE — Progress Notes (Signed)
Pre visit review using our clinic review tool, if applicable. No additional management support is needed unless otherwise documented below in the visit note. 

## 2015-04-24 NOTE — Patient Instructions (Signed)
We will check the labs today and call you back with the results.   The pain in your leg is likely trochanteric bursitis and we will give you some exercises. If they do not help you can call and schedule with Dr. Tamala Julian the sports medicine doctor.   Trochanteric Bursitis You have hip pain due to trochanteric bursitis. Bursitis means that the sack near the outside of the hip is filled with fluid and inflamed. This sack is made up of protective soft tissue. The pain from trochanteric bursitis can be severe and keep you from sleep. It can radiate to the buttocks or down the outside of the thigh to the knee. The pain is almost always worse when rising from the seated or lying position and with walking. Pain can improve after you take a few steps. It happens more often in people with hip joint and lumbar spine problems, such as arthritis or previous surgery. Very rarely the trochanteric bursa can become infected, and antibiotics and/or surgery may be needed. Treatment often includes an injection of local anesthetic mixed with cortisone medicine. This medicine is injected into the area where it is most tender over the hip. Repeat injections may be necessary if the response to treatment is slow. You can apply ice packs over the tender area for 30 minutes every 2 hours for the next few days. Anti-inflammatory and/or narcotic pain medicine may also be helpful. Limit your activity for the next few days if the pain continues. See your caregiver in 5-10 days if you are not greatly improved.  SEEK IMMEDIATE MEDICAL CARE IF:  You develop severe pain, fever, or increased redness.  You have pain that radiates below the knee. EXERCISES STRETCHING EXERCISES - Trochanteric Bursitis  These exercises may help you when beginning to rehabilitate your injury. Your symptoms may resolve with or without further involvement from your physician, physical therapist, or athletic trainer. While completing these exercises, remember:    Restoring tissue flexibility helps normal motion to return to the joints. This allows healthier, less painful movement and activity.  An effective stretch should be held for at least 30 seconds.  A stretch should never be painful. You should only feel a gentle lengthening or release in the stretched tissue. STRETCH - Iliotibial Band  On the floor or bed, lie on your side so your injured leg is on top. Bend your knee and grab your ankle.  Slowly bring your knee back so that your thigh is in line with your trunk. Keep your heel at your buttocks and gently arch your back so your head, shoulders and hips line up.  Slowly lower your leg so that your knee approaches the floor/bed until you feel a gentle stretch on the outside of your thigh. If you do not feel a stretch and your knee will not fall farther, place the heel of your opposite foot on top of your knee and pull your thigh down farther.  Hold this stretch for __________ seconds.  Repeat __________ times. Complete this exercise __________ times per day. STRETCH - Hamstrings, Supine   Lie on your back. Loop a belt or towel over the ball of your foot as shown.  Straighten your knee and slowly pull on the belt to raise your injured leg. Do not allow the knee to bend. Keep your opposite leg flat on the floor.  Raise the leg until you feel a gentle stretch behind your knee or thigh. Hold this position for __________ seconds.  Repeat __________ times.  Complete this stretch __________ times per day. STRETCH - Quadriceps, Prone   Lie on your stomach on a firm surface, such as a bed or padded floor.  Bend your knee and grasp your ankle. If you are unable to reach your ankle or pant leg, use a belt around your foot to lengthen your reach.  Gently pull your heel toward your buttocks. Your knee should not slide out to the side. You should feel a stretch in the front of your thigh and/or knee.  Hold this position for __________  seconds.  Repeat __________ times. Complete this stretch __________ times per day. STRETCHING - Hip Flexors, Lunge Half kneel with your knee on the floor and your opposite knee bent and directly over your ankle.  Keep good posture with your head over your shoulders. Tighten your buttocks to point your tailbone downward; this will prevent your back from arching too much.  You should feel a gentle stretch in the front of your thigh and/or hip. If you do not feel any resistance, slightly slide your opposite foot forward and then slowly lunge forward so your knee once again lines up over your ankle. Be sure your tailbone remains pointed downward.  Hold this stretch for __________ seconds.  Repeat __________ times. Complete this stretch __________ times per day. STRETCH - Adductors, Lunge  While standing, spread your legs.  Lean away from your injured leg by bending your opposite knee. You may rest your hands on your thigh for balance.  You should feel a stretch in your inner thigh. Hold for __________ seconds.  Repeat __________ times. Complete this exercise __________ times per day.   This information is not intended to replace advice given to you by your health care provider. Make sure you discuss any questions you have with your health care provider.   Document Released: 05/20/2004 Document Revised: 08/27/2014 Document Reviewed: 07/25/2008 Elsevier Interactive Patient Education [employer].

## 2015-04-24 NOTE — Progress Notes (Signed)
   Subjective:    Patient ID: [name], female    DOB: [birth year], 49 y.o.   MRN: [id number]  HPI The patient is a 49 YO female coming in for wellness. No new problems but still having some problems with left thigh pain. Going on about 6 months.   PMH, [facility], social history reviewed and updated.   Review of Systems  Constitutional: Negative for fever, activity change, appetite change, fatigue and unexpected weight change.  HENT: Negative.   Eyes: Negative.   Respiratory: Negative for cough, chest tightness, shortness of breath and wheezing.   Cardiovascular: Negative for chest pain, palpitations and leg swelling.  Gastrointestinal: Negative for nausea, abdominal pain, diarrhea, constipation and abdominal distention.  Musculoskeletal: Positive for myalgias. Negative for arthralgias.  Skin: Negative.   Neurological: Negative.   Psychiatric/Behavioral: Negative.       Objective:   Physical Exam  Constitutional: She is oriented to person, place, and time. She appears well-developed and well-nourished.  Cardiovascular: Normal rate.   Pulmonary/Chest: Effort normal.  Abdominal: Soft. She exhibits no distension. There is no tenderness. There is no rebound.  Musculoskeletal:  Left trochanteric bursa with tenderness, non-radiating  Neurological: She is alert and oriented to person, place, and time. Coordination normal.   Filed Vitals:   04/24/15 1309  BP: 110/70  Pulse: 89  Temp: 98 F (36.7 C)  TempSrc: Oral  Resp: 12  Height: 5\' 2"  (1.575 m)  Weight: 227 lb 6.4 oz (103.148 kg)  SpO2: 98%      Assessment & Plan:  Flu shot given at visit.

## 2015-04-25 LAB — HEPATITIS C ANTIBODY: HCV AB: NEGATIVE

## 2015-05-26 ENCOUNTER — Other Ambulatory Visit: Payer: Self-pay | Admitting: Internal Medicine

## 2015-07-25 ENCOUNTER — Encounter: Payer: Self-pay | Admitting: Internal Medicine

## 2015-07-25 ENCOUNTER — Ambulatory Visit (INDEPENDENT_AMBULATORY_CARE_PROVIDER_SITE_OTHER)
Admission: RE | Admit: 2015-07-25 | Discharge: 2015-07-25 | Disposition: A | Discharge status: Home / Self Care | Payer: BLUE CROSS/BLUE SHIELD | Source: Ambulatory Visit | Attending: Internal Medicine | Admitting: Internal Medicine

## 2015-07-25 ENCOUNTER — Ambulatory Visit (INDEPENDENT_AMBULATORY_CARE_PROVIDER_SITE_OTHER): Payer: BLUE CROSS/BLUE SHIELD | Admitting: Internal Medicine

## 2015-07-25 ENCOUNTER — Ambulatory Visit: Payer: BLUE CROSS/BLUE SHIELD | Admitting: Nurse Practitioner

## 2015-07-25 VITALS — BP 130/80 | HR 81 | Temp 98.4°F | Resp 20 | Wt 215.0 lb

## 2015-07-25 DIAGNOSIS — G8929 Other chronic pain: Secondary | ICD-10-CM | POA: Diagnosis not present

## 2015-07-25 DIAGNOSIS — M546 Pain in thoracic spine: Secondary | ICD-10-CM | POA: Diagnosis not present

## 2015-07-25 DIAGNOSIS — M549 Dorsalgia, unspecified: Principal | ICD-10-CM

## 2015-07-25 DIAGNOSIS — M542 Cervicalgia: Secondary | ICD-10-CM

## 2015-07-25 DIAGNOSIS — F32A Depression, unspecified: Secondary | ICD-10-CM

## 2015-07-25 DIAGNOSIS — F329 Major depressive disorder, single episode, unspecified: Secondary | ICD-10-CM | POA: Diagnosis not present

## 2015-07-25 MED ORDER — TRAMADOL HCL 50 MG PO TABS: 50.0000 mg | ORAL_TABLET | Freq: Three times a day (TID) | ORAL | Status: DC | PRN

## 2015-07-25 MED ORDER — CYCLOBENZAPRINE HCL 5 MG PO TABS: 5.0000 mg | ORAL_TABLET | Freq: Three times a day (TID) | ORAL | Status: DC | PRN

## 2015-07-25 NOTE — Patient Instructions (Signed)
Please take all new medication as prescribed - the pain medication, and muscle relaxer as needed  Please schedule an appointment with Dr Smith/sports medicine for first available, as you leave at the scheduling desk today  Please continue all other medications as before, and refills have been done if requested.  Please have the pharmacy call with any other refills you may need.  Please keep your appointments with your specialists as you may have planned  Please go to the XRAY Department in the Basement (go straight as you get off the elevator) for the x-ray testing  You will be contacted by phone if any changes need to be made immediately.  Otherwise, you will receive a letter about your results with an explanation, but please check with MyChart first.  Please remember to sign up for MyChart if you have not done so, as this will be important to you in the future with finding out test results, communicating by private email, and scheduling acute appointments online when needed.

## 2015-07-25 NOTE — Assessment & Plan Note (Signed)
stable overall by history and exam, denies SI or HI, and pt to continue medical treatment as before,  to f/u any worsening symptoms or concerns

## 2015-07-25 NOTE — Progress Notes (Signed)
Subjective:    Patient ID: [name], female    DOB: [birth year], 50 y.o.   MRN: [id number]  HPI  Here with 2-3 mo onset mild to mod (occas severe) upper mid to right back pain, mostly dull, at least 3 times wk can be debilitating, worse with driving and turning head to left to see the blind spot, sometimes radiates to the neck, advil can help somewhat at 4 at a time.Some worse with right arm and shoulder, as well the head and neck. No fever, Pt denies new neurological symptoms such as new headache, or facial or extremity weakness or numbness  Pt denies chest pain, increased sob or doe, wheezing, orthopnea, PND, increased LE swelling, palpitations, dizziness or syncope.  Pt denies polydipsia, polyuria. No specific xrays or MRI of back. Denies worsening reflux, abd pain, dysphagia, n/v, bowel change or blood.  Has hx of thryoid ca 1995 but no other known malignancy.  Denies urinary symptoms such as dysuria, frequency, urgency, flank pain, hematuria or n/v, fever, chills.  Denies worsening depressive symptoms, suicidal ideation, or panic Past Medical History  Diagnosis Date  . OBESITY   . EATING DISORDER, HX OF   . Hypothyroidism, postsurgical   . Thyroid cancer ([place])     a. Dx 1995->resection and xrt, now on levothyroxine.  . SVT (supraventricular tachycardia) ([place])     a. diagnosed at age 34 - has never seen cardiology.; s/p RFCA of AVNRT 05/10/2013 by Dr Lovena Le  . Anxiety   . Depression    Past Surgical History  Procedure Laterality Date  . Total thyroidectomy  1995  . Cesarean section  1986, 1996  . Knee arthroscopy w/ acl reconstruction Right 2012    a collins  . Lipoma excision Right 1990's X2    neck  . Ablation  05/10/2013    RFCA of AVNRT by Dr Lovena Le  . Appendectomy  2004  . Tubal ligation  1996  . Cardiac valve surgery  04/2013  . Supraventricular tachycardia ablation N/A 05/10/2013    Procedure: SUPRAVENTRICULAR TACHYCARDIA ABLATION;  Surgeon: Evans Lance, MD;   Location: [facility] CATH LAB;  Service: Cardiovascular;  Laterality: N/A;    reports that she has never smoked. She has never used smokeless tobacco. She reports that she drinks alcohol. She reports that she does not use illicit drugs. family history includes Alzheimer's disease in her maternal grandmother and mother; Colon cancer in her paternal grandfather and paternal grandmother; Colon cancer (age of onset: 20) in her father; Lung cancer in her father. There is no history of Stomach cancer. No Known Allergies Current Outpatient Prescriptions on File Prior to Visit  Medication Sig Dispense Refill  . amphetamine-dextroamphetamine (ADDERALL) 10 MG tablet Take 1 by mouth twice a day    . escitalopram (LEXAPRO) 20 MG tablet Take 20 mg by mouth daily.    Marland Kitchen levothyroxine (SYNTHROID, LEVOTHROID) 150 MCG tablet Take 1 tablet (150 mcg total) by mouth daily before breakfast. 90 tablet 1  . LORazepam (ATIVAN) 0.5 MG tablet Take 0.5 mg by mouth 2 (two) times daily.   4  . valACYclovir (VALTREX) 1000 MG tablet Take 1 tablet (1,000 mg total) by mouth 3 (three) times daily. 21 tablet 1  . zolpidem (AMBIEN) 5 MG tablet Take 5 mg by mouth.     No current facility-administered medications on file prior to visit.   Review of Systems  Constitutional: Negative for unusual diaphoresis or night sweats HENT: Negative for ear swelling or discharge  Eyes: Negative for worsening visual haziness  Respiratory: Negative for choking and stridor.   Gastrointestinal: Negative for distension or worsening eructation Genitourinary: Negative for retention or change in urine volume.  Musculoskeletal: Negative for other MSK pain or swelling Skin: Negative for color change and worsening wound Neurological: Negative for tremors and numbness other than noted  Psychiatric/Behavioral: Negative for decreased concentration or agitation other than above       Objective:   Physical Exam BP 130/80 mmHg  Pulse 81  Temp(Src) 98.4 F (36.9  C) (Oral)  Resp 20  Wt 215 lb (97.523 kg)  SpO2 95%  LMP 07/18/2015 VS noted,  Constitutional: Pt appears in no apparent distress HENT: Head: NCAT.  Right Ear: External ear normal.  Left Ear: External ear normal.  Eyes: . Pupils are equal, round, and reactive to light. Conjunctivae and EOM are normal Neck: Normal range of motion. Neck supple.  Cardiovascular: Normal rate and regular rhythm.   Pulmonary/Chest: Effort normal and breath sounds without rales or wheezing.  Abd:  Soft, NT, ND, + BS Neurological: Pt is alert. Not confused , motor grossly intact Skin: Skin is warm. No rash, no LE edema Psychiatric: Pt behavior is normal. No agitation. not depressed affect Has bilat upper thoracic lower cervical paravertebral tender       Assessment & Plan:

## 2015-07-25 NOTE — Assessment & Plan Note (Signed)
Has known hx of cspine djd, etiology this pain unclear, for pain control, muscle relaxer and cspine films,  to f/u any worsening symptoms or concerns

## 2015-07-25 NOTE — Assessment & Plan Note (Signed)
approx 3 mo, now worsening, suspect related to MSK /upper back pain due to 8 hrs computer use daily for work, for thoracic spine films, cxr,  to f/u any worsening symptoms or concerns

## 2015-07-25 NOTE — Progress Notes (Signed)
Pre visit review using our clinic review tool, if applicable. No additional management support is needed unless otherwise documented below in the visit note. 

## 2015-08-13 ENCOUNTER — Ambulatory Visit: Payer: BLUE CROSS/BLUE SHIELD | Admitting: Family Medicine

## 2015-11-03 ENCOUNTER — Other Ambulatory Visit: Payer: Self-pay | Admitting: Geriatric Medicine

## 2015-11-03 ENCOUNTER — Telehealth: Payer: Self-pay | Admitting: Emergency Medicine

## 2015-11-03 MED ORDER — VALACYCLOVIR HCL 1 G PO TABS: 1000.0000 mg | ORAL_TABLET | Freq: Three times a day (TID) | ORAL | Status: DC

## 2015-11-03 NOTE — Telephone Encounter (Signed)
Patient called and stated shes going out of town and needs a refill on valACYclovir (VALTREX) 1000 MG tablet. Pharmacy- [facility] in [place]. Please advise thanks.

## 2015-11-17 ENCOUNTER — Encounter: Payer: Self-pay | Admitting: Gastroenterology

## 2015-12-08 ENCOUNTER — Encounter: Payer: Self-pay | Admitting: Gastroenterology

## 2015-12-09 ENCOUNTER — Other Ambulatory Visit: Payer: Self-pay | Admitting: Internal Medicine

## 2016-01-21 ENCOUNTER — Ambulatory Visit (AMBULATORY_SURGERY_CENTER): Payer: Self-pay | Admitting: *Deleted

## 2016-01-21 VITALS — Ht 62.0 in | Wt 231.0 lb

## 2016-01-21 DIAGNOSIS — Z800 Family history of malignant neoplasm of digestive organs: Secondary | ICD-10-CM

## 2016-01-21 DIAGNOSIS — Z86010 Personal history of colonic polyps: Secondary | ICD-10-CM

## 2016-01-21 MED ORDER — NA SULFATE-K SULFATE-MG SULF 17.5-3.13-1.6 GM/177ML PO SOLN: ORAL | 0 refills | Status: DC

## 2016-01-21 NOTE — Progress Notes (Signed)
Patient denies any allergies to eggs or soy. Patient denies any problems with anesthesia/sedation. Patient denies any oxygen use at home and does not take any diet/weight loss medications. Pt declined EMMI education video.

## 2016-02-04 ENCOUNTER — Ambulatory Visit (AMBULATORY_SURGERY_CENTER): Payer: 59 | Admitting: Gastroenterology

## 2016-02-04 ENCOUNTER — Encounter: Payer: Self-pay | Admitting: Gastroenterology

## 2016-02-04 VITALS — BP 116/94 | HR 62 | Temp 96.8°F | Resp 10 | Ht 62.0 in | Wt 231.0 lb

## 2016-02-04 DIAGNOSIS — Z86010 Personal history of colonic polyps: Secondary | ICD-10-CM | POA: Diagnosis not present

## 2016-02-04 DIAGNOSIS — D127 Benign neoplasm of rectosigmoid junction: Secondary | ICD-10-CM | POA: Diagnosis not present

## 2016-02-04 DIAGNOSIS — D126 Benign neoplasm of colon, unspecified: Secondary | ICD-10-CM

## 2016-02-04 DIAGNOSIS — Z800 Family history of malignant neoplasm of digestive organs: Secondary | ICD-10-CM | POA: Diagnosis not present

## 2016-02-04 DIAGNOSIS — D128 Benign neoplasm of rectum: Secondary | ICD-10-CM

## 2016-02-04 MED ORDER — SODIUM CHLORIDE 0.9 % IV SOLN: 500.0000 mL | INTRAVENOUS | Status: DC

## 2016-02-04 NOTE — Patient Instructions (Signed)
YOU HAD AN ENDOSCOPIC PROCEDURE TODAY AT [facility]:   Refer to the procedure report that was given to you for any specific questions about what was found during the examination.  If the procedure report does not answer your questions, please call your gastroenterologist to clarify.  If you requested that your care partner not be given the details of your procedure findings, then the procedure report has been included in a sealed envelope for you to review at your convenience later.  YOU SHOULD EXPECT: Some feelings of bloating in the abdomen. Passage of more gas than usual.  Walking can help get rid of the air that was put into your GI tract during the procedure and reduce the bloating. If you had a lower endoscopy (such as a colonoscopy or flexible sigmoidoscopy) you may notice spotting of blood in your stool or on the toilet paper. If you underwent a bowel prep for your procedure, you may not have a normal bowel movement for a few days.  Please Note:  You might notice some irritation and congestion in your nose or some drainage.  This is from the oxygen used during your procedure.  There is no need for concern and it should clear up in a day or so.  SYMPTOMS TO REPORT IMMEDIATELY:   Following lower endoscopy (colonoscopy or flexible sigmoidoscopy):  Excessive amounts of blood in the stool  Significant tenderness or worsening of abdominal pains  Swelling of the abdomen that is new, acute  Fever of 100F or higher   Following upper endoscopy (EGD)  Vomiting of blood or coffee ground material  New chest pain or pain under the shoulder blades  Painful or persistently difficult swallowing  New shortness of breath  Fever of 100F or higher  Black, tarry-looking stools  For urgent or emergent issues, a gastroenterologist can be reached at any hour by calling [phone].   DIET:  We do recommend a small meal at first, but then you may proceed to your regular diet.  Drink  plenty of fluids but you should avoid alcoholic beverages for 24 hours.  ACTIVITY:  You should plan to take it easy for the rest of today and you should NOT DRIVE or use heavy machinery until tomorrow (because of the sedation medicines used during the test).    FOLLOW UP: Our staff will call the number listed on your records the next business day following your procedure to check on you and address any questions or concerns that you may have regarding the information given to you following your procedure. If we do not reach you, we will leave a message.  However, if you are feeling well and you are not experiencing any problems, there is no need to return our call.  We will assume that you have returned to your regular daily activities without incident.  If any biopsies were taken you will be contacted by phone or by letter within the next 1-3 weeks.  Please call us at [phone] if you have not heard about the biopsies in 3 weeks.    SIGNATURES/CONFIDENTIALITY: You and/or your care partner have signed paperwork which will be entered into your electronic medical record.  These signatures attest to the fact that that the information above on your After Visit Summary has been reviewed and is understood.  Full responsibility of the confidentiality of this discharge information lies with you and/or your care-partner.  Polyps, hemorrhoid information given.  Appointment made for Wednesday, March 03, 2016 at [facility]- arrive at [facility]

## 2016-02-04 NOTE — Progress Notes (Signed)
A/ox3 pleased with MAC, report to Jane RN 

## 2016-02-04 NOTE — Op Note (Addendum)
[facility] Patient Name: [name] Procedure Date: 02/04/2016 8:46 AM MRN: [id number] Endoscopist: Mauri Pole , MD Age: 50 Referring MD:  Date of Birth: 03-18-1966 Gender: Female Account #: [id number] Procedure:                Colonoscopy Indications:              Screening in patient at increased risk: Family                            history of 1st-degree relative with colorectal                            cancer before age 66 years, Colon cancer screening                            in patient at increased risk: Family history of                            colorectal cancer in multiple 2nd degree relatives,                            Surveillance: Personal history of adenomatous                            polyps on last colonoscopy 5 years ago Medicines:                Monitored Anesthesia Care Procedure:                Pre-Anesthesia Assessment:                           - Prior to the procedure, a History and Physical                            was performed, and patient medications and                            allergies were reviewed. The patient's tolerance of                            previous anesthesia was also reviewed. The risks                            and benefits of the procedure and the sedation                            options and risks were discussed with the patient.                            All questions were answered, and informed consent                            was obtained. Prior Anticoagulants: The patient has  taken no previous anticoagulant or antiplatelet                            agents. ASA Grade Assessment: II - A patient with                            mild systemic disease. After reviewing the risks                            and benefits, the patient was deemed in                            satisfactory condition to undergo the procedure.                           After obtaining informed  consent, the colonoscope                            was passed under direct vision. Throughout the                            procedure, the patient's blood pressure, pulse, and                            oxygen saturations were monitored continuously. The                            Model CF-HQ190L [id number]) scope was introduced                            through the anus and advanced to the the terminal                            ileum, with identification of the appendiceal                            orifice and IC valve. The colonoscopy was performed                            without difficulty. The patient tolerated the                            procedure well. The quality of the bowel                            preparation was good. The ileocecal valve,                            appendiceal orifice, and rectum were photographed. Scope In: 9:00:16 AM Scope Out: 9:20:09 AM Scope Withdrawal Time: 0 hours 13 minutes 19 seconds  Total Procedure Duration: 0 hours 19 minutes 53 seconds  Findings:                 The perianal and digital rectal examinations were  normal.                           Two sessile polyps were found in the recto-sigmoid                            colon. The polyps were 2 to 3 mm in size. These                            polyps were removed with a cold biopsy forceps.                            Resection and retrieval were complete.                           Non-bleeding internal hemorrhoids were found during                            retroflexion. The hemorrhoids were small.                           The exam was otherwise without abnormality. Complications:            No immediate complications. Estimated Blood Loss:     Estimated blood loss was minimal. Impression:               - Two 2 to 3 mm polyps at the recto-sigmoid colon,                            removed with a cold biopsy forceps. Resected and                             retrieved.                           - Non-bleeding internal hemorrhoids.                           - The examination was otherwise normal. Recommendation:           - Resume previous diet.                           - Continue present medications.                           - Await pathology results.                           - Repeat colonoscopy in 5 years for surveillance.                           - Refer to Genetic for evaluation and counselling                            given h/o colon cancer in multiple family members                           -  Follow up in GI clinic PRN. Mauri Pole, MD 02/04/2016 9:26:32 AM This report has been signed electronically.

## 2016-02-04 NOTE — Progress Notes (Signed)
Called to room to assist during endoscopic procedure.  Patient ID and intended procedure confirmed with present staff. Received instructions for my participation in the procedure from the performing physician.  

## 2016-02-05 ENCOUNTER — Telehealth: Payer: Self-pay | Admitting: *Deleted

## 2016-02-05 NOTE — Telephone Encounter (Signed)
  Follow up Call-  Call back number 02/04/2016  Post procedure Call Back phone  # [phone]  Permission to leave phone message Yes  Some recent data might be hidden     Patient questions:  Do you have a fever, pain , or abdominal swelling? No. Pain Score  0 *  Have you tolerated food without any problems? Yes.    Have you been able to return to your normal activities? Yes.    Do you have any questions about your discharge instructions: Diet   No. Medications  No. Follow up visit  No.  Do you have questions or concerns about your Care? No.  Actions: * If pain score is 4 or above: No action needed, pain <4.

## 2016-02-16 ENCOUNTER — Encounter: Payer: Self-pay | Admitting: Gastroenterology

## 2016-03-03 ENCOUNTER — Ambulatory Visit: Payer: 59 | Admitting: Gastroenterology

## 2016-05-10 ENCOUNTER — Encounter: Payer: Self-pay | Admitting: Obstetrics & Gynecology

## 2016-05-13 LAB — HM MAMMOGRAPHY

## 2016-05-13 LAB — HM PAP SMEAR

## 2016-06-14 ENCOUNTER — Other Ambulatory Visit: Payer: Self-pay | Admitting: Internal Medicine

## 2016-07-06 NOTE — OR Nursing (Signed)
Dr Nolon Nations is requesting EKG/PAT due to patient's history of SVT with ablation 04/2013 with Dr Lovena Le.  Last EKG 04/2013.  Has not seen Dr Lovena Le since 04/2013 surgery

## 2016-07-07 ENCOUNTER — Other Ambulatory Visit: Payer: Self-pay | Admitting: Obstetrics & Gynecology

## 2016-07-08 ENCOUNTER — Encounter (HOSPITAL_COMMUNITY): Payer: Self-pay

## 2016-07-08 ENCOUNTER — Encounter (HOSPITAL_COMMUNITY)
Admission: RE | Admit: 2016-07-08 | Discharge: 2016-07-08 | Disposition: A | Discharge status: Home / Self Care | Payer: BLUE CROSS/BLUE SHIELD | Source: Ambulatory Visit | Attending: Obstetrics & Gynecology | Admitting: Obstetrics & Gynecology

## 2016-07-08 DIAGNOSIS — Z01818 Encounter for other preprocedural examination: Secondary | ICD-10-CM | POA: Diagnosis present

## 2016-07-08 HISTORY — DX: Postprocedural hypothyroidism: E89.0

## 2016-07-08 LAB — CBC
HEMATOCRIT: 39.3 % (ref 36.0–46.0)
HEMOGLOBIN: 13 g/dL (ref 12.0–15.0)
MCH: 30.2 pg (ref 26.0–34.0)
MCHC: 33.1 g/dL (ref 30.0–36.0)
MCV: 91.4 fL (ref 78.0–100.0)
Platelets: 355 10*3/uL (ref 150–400)
RBC: 4.3 MIL/uL (ref 3.87–5.11)
RDW: 13.2 % (ref 11.5–15.5)
WBC: 7.6 10*3/uL (ref 4.0–10.5)

## 2016-07-08 NOTE — Patient Instructions (Addendum)
Your procedure is scheduled on: Friday July 16, 2016 at 2:15 pm  Enter through the Main Entrance of [facility] at: 12:45  Pick up the phone at the desk and dial [phone].  Call this number if you have problems the morning of surgery: [phone].  Remember: Do NOT eat food: after Midnight on Thursday March 22 Do NOT drink clear liquids after: Take these medicines the morning of surgery with a SIP OF WATER: Synthroid, Effexor, Wellbutrin   STOP ALL VITAMINS AND SUPPLEMENTS TODAY  Do NOT wear jewelry (body piercing), metal hair clips/bobby pins, make-up, or nail polish. Do NOT wear lotions, powders, or perfumes.  You may wear deoderant. Do NOT shave for 48 hours prior to surgery. Do NOT bring valuables to the hospital. Contacts, dentures, or bridgework may not be worn into surgery. Have a responsible adult drive you home and stay with you for 24 hours after your procedure

## 2016-07-08 NOTE — Pre-Procedure Instructions (Signed)
Patient refused EKG.

## 2016-07-16 ENCOUNTER — Ambulatory Visit (HOSPITAL_COMMUNITY)
Admission: RE | Admit: 2016-07-16 | Payer: BLUE CROSS/BLUE SHIELD | Source: Ambulatory Visit | Admitting: Obstetrics & Gynecology

## 2016-07-16 ENCOUNTER — Encounter (HOSPITAL_COMMUNITY): Admission: RE | Payer: Self-pay | Source: Ambulatory Visit

## 2016-07-16 SURGERY — DILATATION & CURETTAGE/HYSTEROSCOPY WITH NOVASURE ABLATION
Anesthesia: Choice

## 2016-08-01 IMAGING — MR MR PELVIS WO/W CM
5 of 10 series · 25 of 48 positions shown · IV contrast (20ml multihance)
Comparison: None.

CLINICAL DATA: Abdominal pain with bloating and heavy menstruation.
Evaluate for ovarian mass.

EXAM:
MRI PELVIS WITHOUT AND WITH CONTRAST
TECHNIQUE: Multiplanar multisequence MR imaging of the pelvis was performed
both before and after administration of intravenous contrast.
CONTRAST:  20mL MULTIHANCE GADOBENATE DIMEGLUMINE 529 MG/ML IV SOLN

[Series 2: t2_tse_sag · sagittal · 4.0mm · 0.62mm/px · 6 of 27 slices shown]
[im 1/27]
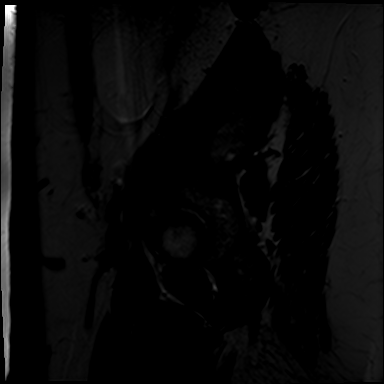
[im 6/27]
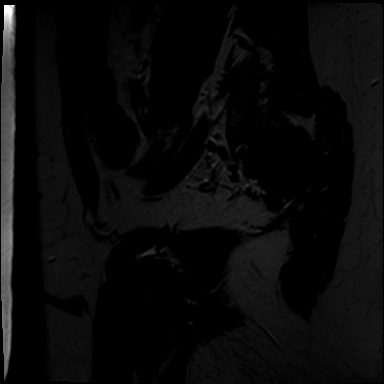
[im 11/27]
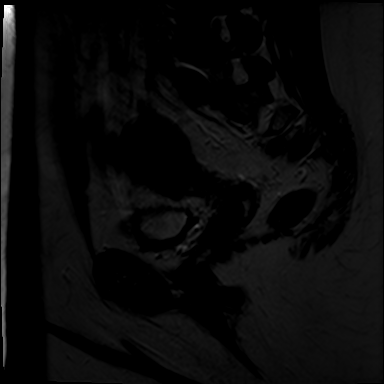
[im 16/27]
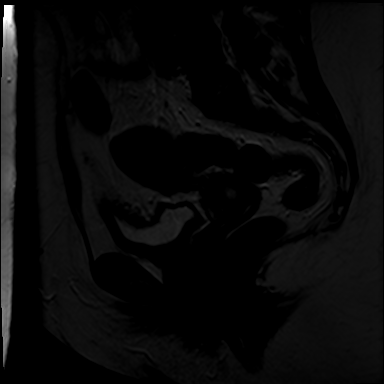
[im 21/27]
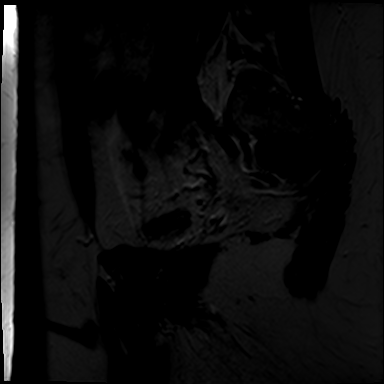
[im 27/27]
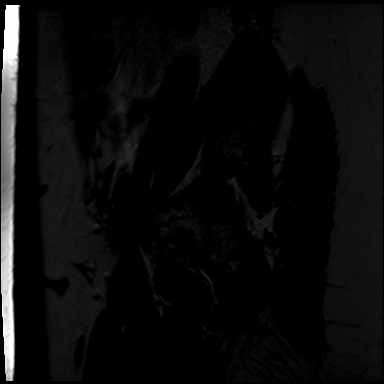

[Series 8: T2 · coronal · 5.0mm · 0.55mm/px · 6 of 32 slices shown]
[im 1/32]
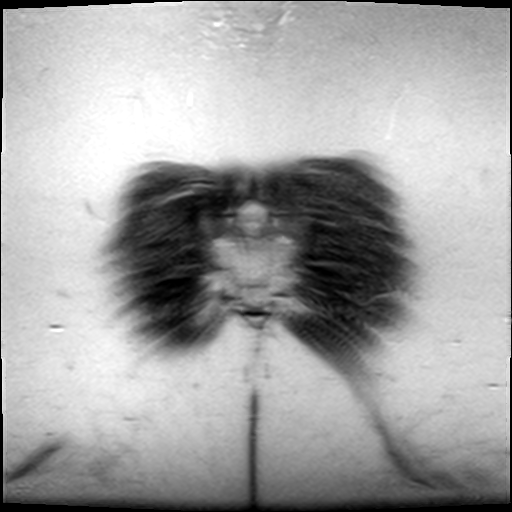
[im 7/32]
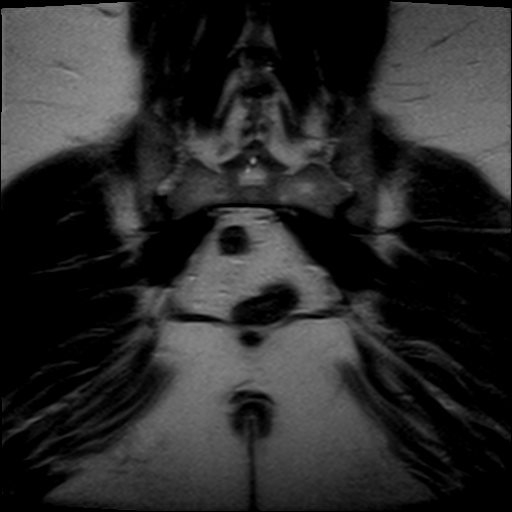
[im 13/32]
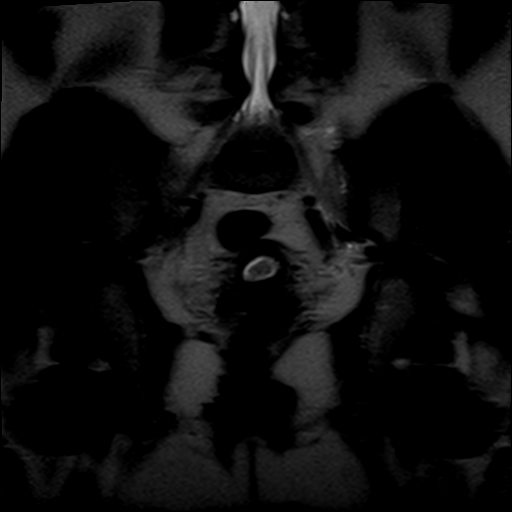
[im 19/32]
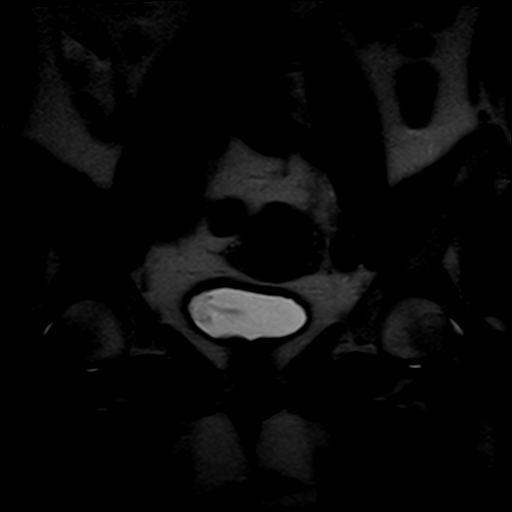
[im 25/32]
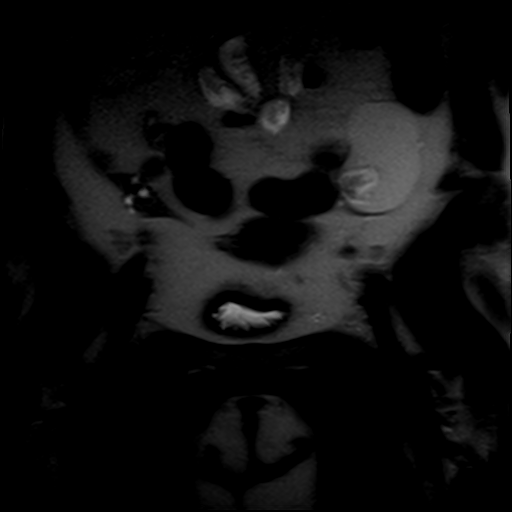
[im 32/32]
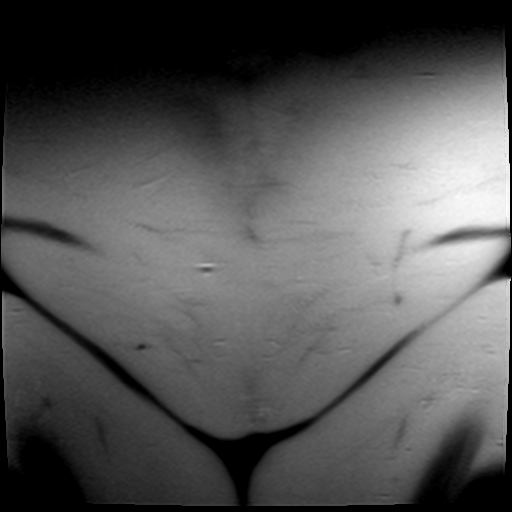

[Series 9: t2_tse axial · axial · 6.0mm · 0.62mm/px · z∈[-52,+128]mm · 5 of 26 slices shown]
[im 1/26]
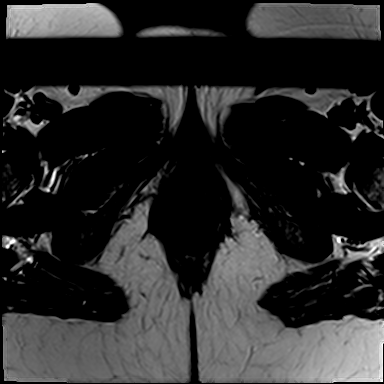
[im 7/26]
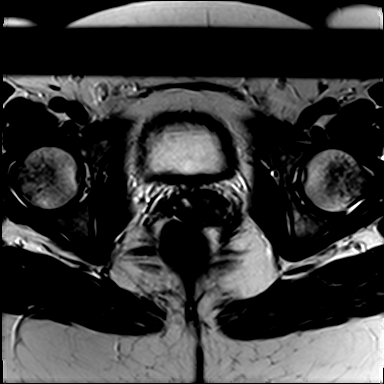
[im 13/26]
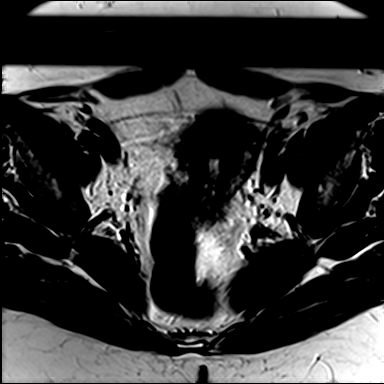
[im 19/26]
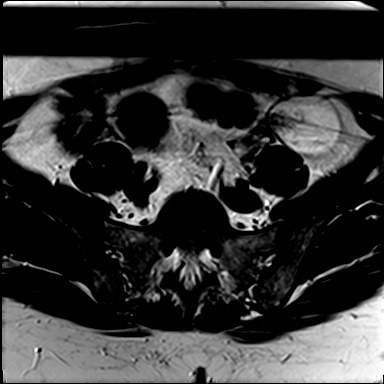
[im 26/26]
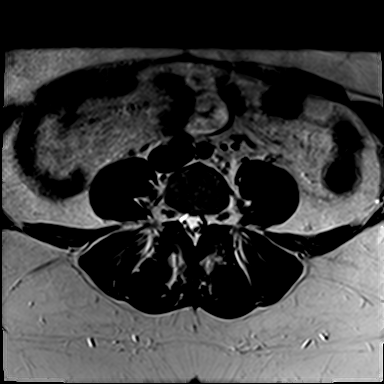

[Series 10: t2_tse axial fs · axial · 6.0mm · 0.62mm/px · z∈[-52,+128]mm · 4 of 26 slices shown]
[im 1/26]
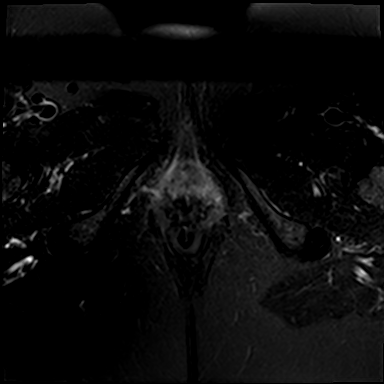
[im 9/26]
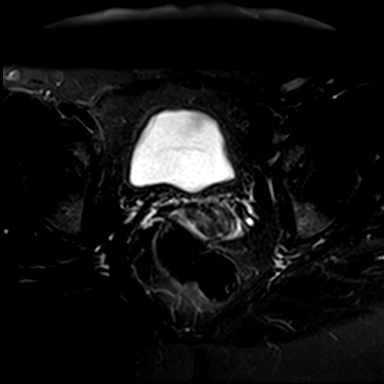
[im 17/26]
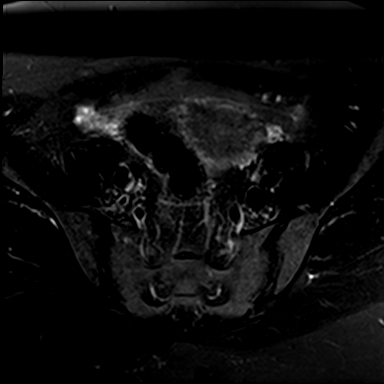
[im 26/26]
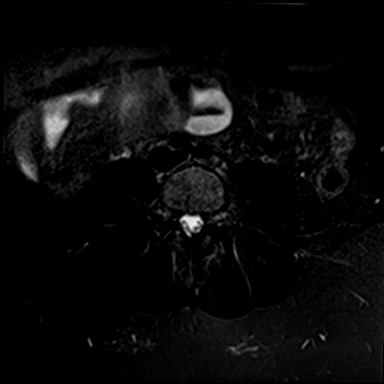

[Series 11: axial spgr · axial · 6.0mm · 0.49mm/px · z∈[-52,+128]mm · 4 of 26 slices shown]
[im 1/26]
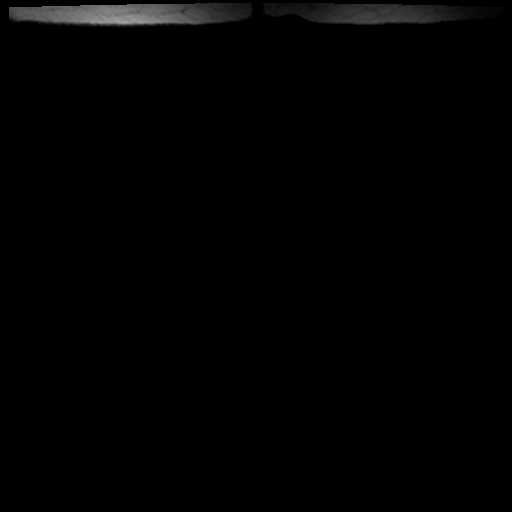
[im 9/26]
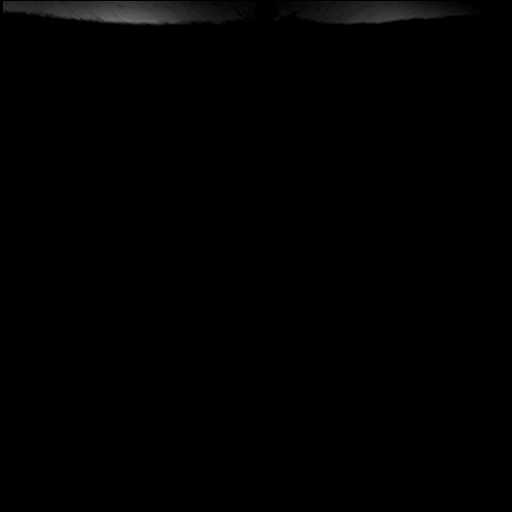
[im 17/26]
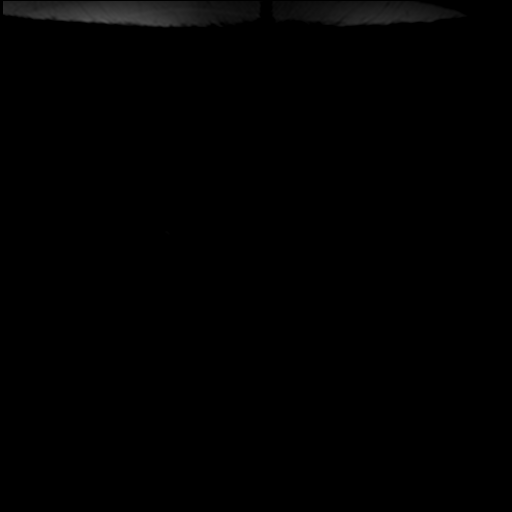
[im 26/26]
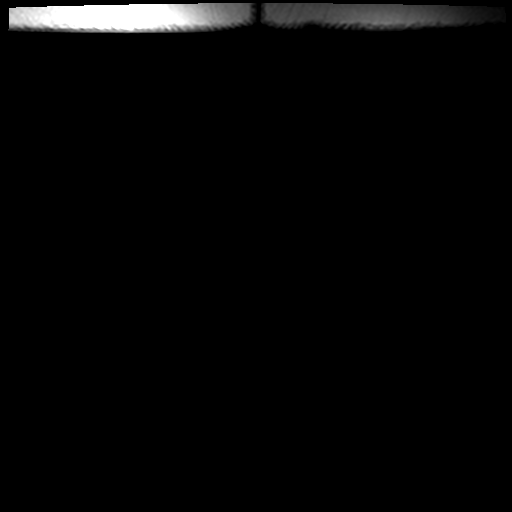

[25 of 48 positions shown; findings below may reference images not displayed]

FINDINGS: There is a primarily cystic left adnexal mass which measures 6.2 x
3.9 x 5.0 cm. This is primarily thin walled, although there is an
area of complexity inferomedially where there is a 2.2 cm soft
tissue component with T2 hyperintensity. Enhancement within this
area appears minimal, although is partly obscured by artifact. The
left ovary is medial to this cystic lesion.

The right ovary appears normal. There are cervical nabothian cysts.
No myometrial abnormalities are seen. The bladder and distal ureters
appear normal. There is no pelvic lymphadenopathy or ascites. The
distal colon appears normal.
IMPRESSION: 1. Primarily cystic 6 cm left adnexal lesion does have an area of
solid complexity inferomedially but minimal if any enhancement.
These features are indeterminate, but because of the solid
component, neoplasm (likely benign)must be excluded. Surgical
excision recommended. This recommendation follows ACR consensus
guidelines: White Paper of the ACR Incidental Findings Committee II
on Adnexal Findings. [HOSPITAL] [DATE].
2. The right ovary and uterus demonstrate no significant findings.
Cervical nabothian cysts noted.
3. These results will be called to the ordering clinician or
representative by the Radiologist Assistant, and communication
documented in the PACS or zVision Dashboard.

## 2016-11-18 ENCOUNTER — Telehealth: Payer: Self-pay

## 2016-11-18 ENCOUNTER — Encounter: Payer: Self-pay | Admitting: Obstetrics & Gynecology

## 2016-11-18 ENCOUNTER — Ambulatory Visit (INDEPENDENT_AMBULATORY_CARE_PROVIDER_SITE_OTHER): Payer: 59 | Admitting: Obstetrics & Gynecology

## 2016-11-18 VITALS — BP 128/82

## 2016-11-18 DIAGNOSIS — N920 Excessive and frequent menstruation with regular cycle: Secondary | ICD-10-CM | POA: Diagnosis not present

## 2016-11-18 NOTE — Telephone Encounter (Signed)
Left message for patient to call me about scheduling. I did let her know that I will be off tomorrow.

## 2016-11-18 NOTE — Patient Instructions (Signed)
1. Menorrhagia with regular cycle Pelvic US 05/2016 showed a thickened Endometrial line at 22+ mm with no focal lesion, no increased BF on Doppler.  An EBx 05/2016 revealed a benign secretory endometrium, negative for hyperplasia or malignancy.  Menorrhagia resistant to Progestin-Pill.  Declines Progestin IUD.  Decision to proceed with Hysteroscopy, D+C, Novasure Endometrial Ablation.  Surgery and risks reviewed thoroughly.                          Patient was counseled as to the risk of surgery to include the following:  1. Infection (prohylactic antibiotics will be administered)  2. DVT/Pulmonary Embolism (prophylactic pneumo compression stockings will be used)  3.Trauma to internal organs requiring additional surgical procedure to repair any injury to     Internal organs requiring perhaps additional hospitalization days.  Patient had received literature information on the procedure scheduled and all her questions were answered and fully accepts all risk.  [name], it was a pleasure to see you here today!     Endometrial Ablation Endometrial ablation is a procedure that destroys the thin inner layer of the lining of the uterus (endometrium). This procedure may be done:  To stop heavy periods.  To stop bleeding that is causing anemia.  To control irregular bleeding.  To treat bleeding caused by small tumors (fibroids) in the endometrium.  This procedure is often an alternative to major surgery, such as removal of the uterus and cervix (hysterectomy). As a result of this procedure:  You may not be able to have children. However, if you are premenopausal (you have not gone through menopause): ? You may still have a small chance of getting pregnant. ? You will need to use a reliable method of birth control after the procedure to prevent pregnancy.  You may stop having a menstrual period, or you may have only a small amount of bleeding during your period. Menstruation may return several  years after the procedure.  Tell a health care provider about:  Any allergies you have.  All medicines you are taking, including vitamins, herbs, eye drops, creams, and over-the-counter medicines.  Any problems you or family members have had with the use of anesthetic medicines.  Any blood disorders you have.  Any surgeries you have had.  Any medical conditions you have. What are the risks? Generally, this is a safe procedure. However, problems may occur, including:  A hole (perforation) in the uterus or bowel.  Infection of the uterus, bladder, or vagina.  Bleeding.  Damage to other structures or organs.  An air bubble in the lung (air embolus).  Problems with pregnancy after the procedure.  Failure of the procedure.  Decreased ability to diagnose cancer in the endometrium.  What happens before the procedure?  You will have tests of your endometrium to make sure there are no pre-cancerous cells or cancer cells present.  You may have an ultrasound of the uterus.  You may be given medicines to thin the endometrium.  Ask your health care provider about: ? Changing or stopping your regular medicines. This is especially important if you take diabetes medicines or blood thinners. ? Taking medicines such as aspirin and ibuprofen. These medicines can thin your blood. Do not take these medicines before your procedure if your doctor tells you not to.  Plan to have someone take you home from the hospital or clinic. What happens during the procedure?  You will lie on an exam table with your  feet and legs supported as in a pelvic exam.  To lower your risk of infection: ? Your health care team will wash or sanitize their hands and put on germ-free (sterile) gloves. ? Your genital area will be washed with soap.  An IV tube will be inserted into one of your veins.  You will be given a medicine to help you relax (sedative).  A surgical instrument with a light and camera  (resectoscope) will be inserted into your vagina and moved into your uterus. This allows your surgeon to see inside your uterus.  Endometrial tissue will be removed using one of the following methods: ? Radiofrequency. This method uses a radiofrequency-alternating electric current to remove the endometrium. ? Cryotherapy. This method uses extreme cold to freeze the endometrium. ? Heated-free liquid. This method uses a heated saltwater (saline) solution to remove the endometrium. ? Microwave. This method uses high-energy microwaves to heat up the endometrium and remove it. ? Thermal balloon. This method involves inserting a catheter with a balloon tip into the uterus. The balloon tip is filled with heated fluid to remove the endometrium. The procedure may vary among health care providers and hospitals. What happens after the procedure?  Your blood pressure, heart rate, breathing rate, and blood oxygen level will be monitored until the medicines you were given have worn off.  As tissue healing occurs, you may notice vaginal bleeding for 4-6 weeks after the procedure. You may also experience: ? Cramps. ? Thin, watery vaginal discharge that is light pink or brown in color. ? A need to urinate more frequently than usual. ? Nausea.  Do not drive for 24 hours if you were given a sedative.  Do not have sex or insert anything into your vagina until your health care provider approves. Summary  Endometrial ablation is done to treat the many causes of heavy menstrual bleeding.  The procedure may be done only after medications have been tried to control the bleeding.  Plan to have someone take you home from the hospital or clinic. This information is not intended to replace advice given to you by your health care provider. Make sure you discuss any questions you have with your health care provider. Document Released: 02/20/2004 Document Revised: 04/29/2016 Document Reviewed: 04/29/2016 Elsevier  Interactive Patient Education  2017 [employer].

## 2016-11-18 NOTE — Progress Notes (Signed)
[name] 1966-03-23 [id number]   History:    51 y.o.  G4P2A2 Married.  C/S x 2 1986 and 1996.  BT/S 1996.  RP:  Progressively worsening Menorrhagia.  HPI:  Progressively worsening Menorrhagia over the past 2 years, with heavy menses x 2 weeks every month currently.  Didn't improve on Progestin-pill in the past.  Pelvic US 05/2016 showed a thickened Endometrial line at 22+ mm with no focal lesion, no increased BF on Doppler.  An EBx done the same day revealed a benign secretory endometrium, negative for hyperplasia or malignancy.  Patient was offered attempting to control her heavy menses with a Progestin IUD, but opted for Novasure Endometrial ablation.  A HSC/D+C/Novasure Endometrial Ablation procedure was scheduled for 06/2016, but patient had to cancel because of Insurance issues.  She is not ready to proceed with surgery.  Past medical history,surgical history, family history and social history were all reviewed and documented in the EPIC chart.  Gynecologic History No LMP recorded. Contraception: tubal ligation Last Pap: 04/2016. Results were: Normal/HPV HR neg Last mammogram: 04/2016.  Results were: normal   ROS: A ROS was performed and pertinent positives and negatives are included in the history.  GENERAL: No fevers or chills. HEENT: No change in vision, no earache, sore throat or sinus congestion. NECK: No pain or stiffness. CARDIOVASCULAR: No chest pain or pressure. No palpitations. PULMONARY: No shortness of breath, cough or wheeze. GASTROINTESTINAL: No abdominal pain, nausea, vomiting or diarrhea, melena or bright red blood per rectum. GENITOURINARY: No urinary frequency, urgency, hesitancy or dysuria. MUSCULOSKELETAL: No joint or muscle pain, no back pain, no recent trauma. DERMATOLOGIC: No rash, no itching, no lesions. ENDOCRINE: No polyuria, polydipsia, no heat or cold intolerance. No recent change in weight. HEMATOLOGICAL: No anemia or easy bruising or bleeding. NEUROLOGIC:  No headache, seizures, numbness, tingling or weakness. PSYCHIATRIC: No depression, no loss of interest in normal activity or change in sleep pattern.     Exam:   BP 128/82 Comment: lg cuff  There is no height or weight on file to calculate BMI.  General appearance : Well developed well nourished female. No acute distress  Abdomen: No palpable masses or tenderness  Pelvic: Vulva normal  Bartholin, Urethra, Skene Glands: Within normal limits             Vagina: No gross lesions or discharge  Cervix: No gross lesions or discharge.  Dark mild/moderate menstrual flow.  Uterus  AV, normal size, shape and consistency, non-tender and mobile  Adnexa  Without masses or tenderness  Anus and perineum  normal   Assessment/Plan:  51 y.o. female   1. Menorrhagia with regular cycle Pelvic US 05/2016 showed a thickened Endometrial line at 22+ mm with no focal lesion, no increased BF on Doppler.  An EBx 05/2016 revealed a benign secretory endometrium, negative for hyperplasia or malignancy.  Menorrhagia resistant to Progestin-Pill.  Declines Progestin IUD.  Decision to proceed with Hysteroscopy, D+C, Novasure Endometrial Ablation.  Surgery and risks reviewed thoroughly.                          Patient was counseled as to the risk of surgery to include the following:  1. Infection (prohylactic antibiotics will be administered)  2. DVT/Pulmonary Embolism (prophylactic pneumo compression stockings will be used)  3.Trauma to internal organs requiring additional surgical procedure to repair any injury to     Internal organs requiring perhaps additional hospitalization days.  Patient had received literature information on the procedure scheduled and all her questions were answered and fully accepts all risk.  Counseling on above issues >50% x 25 minutes.  Marie-Lyne LavoieMD11:[id number] AMTD      [name] MD, 10:47 AM 11/18/2016

## 2016-11-19 ENCOUNTER — Telehealth: Payer: Self-pay | Admitting: *Deleted

## 2016-11-19 NOTE — Telephone Encounter (Signed)
Pt had OV yesterday thought a Rx would be at pharmacy to help stop bleeding, pt said Rx is not there, I didn't see anything in office note. Please advise

## 2016-11-21 ENCOUNTER — Other Ambulatory Visit: Payer: Self-pay | Admitting: Internal Medicine

## 2016-11-22 MED ORDER — NORETHINDRONE ACETATE 5 MG PO TABS: 5.0000 mg | ORAL_TABLET | Freq: Three times a day (TID) | ORAL | 2 refills | Status: DC | PRN

## 2016-11-22 NOTE — Telephone Encounter (Signed)
Left on voicemail Rx sent 

## 2016-11-22 NOTE — Telephone Encounter (Signed)
Sending Aygestin until surgery as needed.

## 2016-11-22 NOTE — Addendum Note (Signed)
Addended by: [name] on: 11/22/2016 07:24 AM   Modules accepted: Orders

## 2016-11-22 NOTE — Telephone Encounter (Signed)
Patient called in voice mail.  I called her back and got her voice mail. Left her a couple of possible dates for surgery to  review and let me know.

## 2016-11-23 NOTE — Telephone Encounter (Signed)
Patient called me back. We discussed that she has two insurances and we will not ask for surgery prepymt but will file both plans and bill her if any balance due.  I will mail her pamphlet from [facility].  Scheduled her for 12/24/16 at 7:30am at [facility].

## 2016-12-02 NOTE — Progress Notes (Signed)
LM on VM for Juliann Pulse, scheduler to inform Dr. Dellis Filbert to place orders in [facility] as patient is being scheduled for a pre-op appointment!

## 2016-12-03 ENCOUNTER — Other Ambulatory Visit: Payer: Self-pay | Admitting: Internal Medicine

## 2016-12-16 ENCOUNTER — Encounter (HOSPITAL_BASED_OUTPATIENT_CLINIC_OR_DEPARTMENT_OTHER): Payer: Self-pay | Admitting: *Deleted

## 2016-12-20 ENCOUNTER — Encounter (HOSPITAL_BASED_OUTPATIENT_CLINIC_OR_DEPARTMENT_OTHER): Payer: Self-pay | Admitting: *Deleted

## 2016-12-20 NOTE — Progress Notes (Addendum)
NPO AFTER MN.  ARRIVE AT 0600.  NEEDS URINE PREG.  GETTING CBC DONE Tuesday 12-21-2016.  WILL TAKE AM MEDS W/ SIPS OF WATER DOS WITH EXCEPTION NO ADDERALL.  ASK MDA DOS IF EKG NEEDED FOR HX SVT, PT DOES NOT HAVE S&S.

## 2016-12-21 DIAGNOSIS — E890 Postprocedural hypothyroidism: Secondary | ICD-10-CM | POA: Diagnosis not present

## 2016-12-21 DIAGNOSIS — N840 Polyp of corpus uteri: Secondary | ICD-10-CM | POA: Diagnosis not present

## 2016-12-21 DIAGNOSIS — F329 Major depressive disorder, single episode, unspecified: Secondary | ICD-10-CM | POA: Diagnosis not present

## 2016-12-21 DIAGNOSIS — Z85850 Personal history of malignant neoplasm of thyroid: Secondary | ICD-10-CM | POA: Diagnosis not present

## 2016-12-21 DIAGNOSIS — N920 Excessive and frequent menstruation with regular cycle: Secondary | ICD-10-CM | POA: Diagnosis present

## 2016-12-21 DIAGNOSIS — Z885 Allergy status to narcotic agent status: Secondary | ICD-10-CM | POA: Diagnosis not present

## 2016-12-21 DIAGNOSIS — Z6841 Body Mass Index (BMI) 40.0 and over, adult: Secondary | ICD-10-CM | POA: Diagnosis not present

## 2016-12-21 DIAGNOSIS — Z923 Personal history of irradiation: Secondary | ICD-10-CM | POA: Diagnosis not present

## 2016-12-21 DIAGNOSIS — F419 Anxiety disorder, unspecified: Secondary | ICD-10-CM | POA: Diagnosis not present

## 2016-12-21 DIAGNOSIS — Z86010 Personal history of colonic polyps: Secondary | ICD-10-CM | POA: Diagnosis not present

## 2016-12-21 LAB — CBC
HEMATOCRIT: 33.4 % — AB (ref 36.0–46.0)
Hemoglobin: 10.5 g/dL — ABNORMAL LOW (ref 12.0–15.0)
MCH: 26.9 pg (ref 26.0–34.0)
MCHC: 31.4 g/dL (ref 30.0–36.0)
MCV: 85.4 fL (ref 78.0–100.0)
PLATELETS: 428 10*3/uL — AB (ref 150–400)
RBC: 3.91 MIL/uL (ref 3.87–5.11)
RDW: 14.1 % (ref 11.5–15.5)
WBC: 8.7 10*3/uL (ref 4.0–10.5)

## 2016-12-24 ENCOUNTER — Ambulatory Visit (HOSPITAL_BASED_OUTPATIENT_CLINIC_OR_DEPARTMENT_OTHER): Payer: 59 | Admitting: Anesthesiology

## 2016-12-24 ENCOUNTER — Encounter (HOSPITAL_BASED_OUTPATIENT_CLINIC_OR_DEPARTMENT_OTHER): Payer: Self-pay

## 2016-12-24 ENCOUNTER — Encounter (HOSPITAL_BASED_OUTPATIENT_CLINIC_OR_DEPARTMENT_OTHER)
Admission: RE | Disposition: A | Discharge status: Home / Self Care | Payer: Self-pay | Source: Ambulatory Visit | Attending: Obstetrics & Gynecology

## 2016-12-24 ENCOUNTER — Ambulatory Visit (HOSPITAL_BASED_OUTPATIENT_CLINIC_OR_DEPARTMENT_OTHER)
Admission: RE | Admit: 2016-12-24 | Discharge: 2016-12-24 | Disposition: A | Discharge status: Home / Self Care | Payer: 59 | Source: Ambulatory Visit | Attending: Obstetrics & Gynecology | Admitting: Obstetrics & Gynecology

## 2016-12-24 DIAGNOSIS — N920 Excessive and frequent menstruation with regular cycle: Secondary | ICD-10-CM | POA: Insufficient documentation

## 2016-12-24 DIAGNOSIS — Z6841 Body Mass Index (BMI) 40.0 and over, adult: Secondary | ICD-10-CM | POA: Insufficient documentation

## 2016-12-24 DIAGNOSIS — Z923 Personal history of irradiation: Secondary | ICD-10-CM | POA: Insufficient documentation

## 2016-12-24 DIAGNOSIS — N840 Polyp of corpus uteri: Secondary | ICD-10-CM | POA: Insufficient documentation

## 2016-12-24 DIAGNOSIS — Z85850 Personal history of malignant neoplasm of thyroid: Secondary | ICD-10-CM | POA: Insufficient documentation

## 2016-12-24 DIAGNOSIS — F419 Anxiety disorder, unspecified: Secondary | ICD-10-CM | POA: Insufficient documentation

## 2016-12-24 DIAGNOSIS — F329 Major depressive disorder, single episode, unspecified: Secondary | ICD-10-CM | POA: Insufficient documentation

## 2016-12-24 DIAGNOSIS — E890 Postprocedural hypothyroidism: Secondary | ICD-10-CM | POA: Insufficient documentation

## 2016-12-24 DIAGNOSIS — Z86010 Personal history of colonic polyps: Secondary | ICD-10-CM | POA: Insufficient documentation

## 2016-12-24 DIAGNOSIS — Z885 Allergy status to narcotic agent status: Secondary | ICD-10-CM | POA: Insufficient documentation

## 2016-12-24 HISTORY — DX: Personal history of other diseases of the female genital tract: Z87.42

## 2016-12-24 HISTORY — DX: Personal history of adenomatous and serrated colon polyps: Z86.0101

## 2016-12-24 HISTORY — DX: Personal history of other mental and behavioral disorders: Z86.59

## 2016-12-24 HISTORY — PX: DILATATION & CURETTAGE/HYSTEROSCOPY WITH MYOSURE: SHX6511

## 2016-12-24 HISTORY — DX: Personal history of other diseases of the circulatory system: Z86.79

## 2016-12-24 HISTORY — DX: Personal history of irradiation: Z92.3

## 2016-12-24 HISTORY — DX: Personal history of malignant neoplasm of thyroid: Z85.850

## 2016-12-24 HISTORY — DX: Major depressive disorder, single episode, unspecified: F32.9

## 2016-12-24 HISTORY — PX: HYSTEROSCOPY WITH NOVASURE: SHX5574

## 2016-12-24 HISTORY — DX: Personal history of colonic polyps: Z86.010

## 2016-12-24 LAB — POCT PREGNANCY, URINE: PREG TEST UR: NEGATIVE

## 2016-12-24 SURGERY — DILATATION & CURETTAGE/HYSTEROSCOPY WITH MYOSURE
Anesthesia: General | Site: Vagina

## 2016-12-24 MED ORDER — DEXAMETHASONE SODIUM PHOSPHATE 10 MG/ML IJ SOLN
INTRAMUSCULAR | Status: AC
  Filled 2016-12-24: qty 1

## 2016-12-24 MED ORDER — CHLOROPROCAINE HCL 1 % IJ SOLN
INTRAMUSCULAR | Status: DC | PRN
  Administered 2016-12-24: 9 mL

## 2016-12-24 MED ORDER — FENTANYL CITRATE (PF) 100 MCG/2ML IJ SOLN
25.0000 ug | INTRAMUSCULAR | Status: DC | PRN
  Filled 2016-12-24: qty 0.5

## 2016-12-24 MED ORDER — PROMETHAZINE HCL 25 MG/ML IJ SOLN
6.2500 mg | INTRAMUSCULAR | Status: DC | PRN
  Filled 2016-12-24: qty 1

## 2016-12-24 MED ORDER — FENTANYL CITRATE (PF) 100 MCG/2ML IJ SOLN
INTRAMUSCULAR | Status: AC
  Filled 2016-12-24: qty 2

## 2016-12-24 MED ORDER — KETOROLAC TROMETHAMINE 30 MG/ML IJ SOLN
INTRAMUSCULAR | Status: AC
  Filled 2016-12-24: qty 1

## 2016-12-24 MED ORDER — DEXAMETHASONE SODIUM PHOSPHATE 4 MG/ML IJ SOLN
INTRAMUSCULAR | Status: DC | PRN
  Administered 2016-12-24: 10 mg via INTRAVENOUS

## 2016-12-24 MED ORDER — SODIUM CHLORIDE 0.9 % IR SOLN
Status: DC | PRN
  Administered 2016-12-24: 3000 mL via INTRAVESICAL

## 2016-12-24 MED ORDER — ACETAMINOPHEN 10 MG/ML IV SOLN
1000.0000 mg | Freq: Once | INTRAVENOUS | Status: DC
  Filled 2016-12-24: qty 100

## 2016-12-24 MED ORDER — LIDOCAINE 2% (20 MG/ML) 5 ML SYRINGE
INTRAMUSCULAR | Status: DC | PRN
  Administered 2016-12-24: 60 mg via INTRAVENOUS

## 2016-12-24 MED ORDER — LACTATED RINGERS IV SOLN
INTRAVENOUS | Status: DC
  Administered 2016-12-24: 07:00:00 via INTRAVENOUS
  Filled 2016-12-24: qty 1000

## 2016-12-24 MED ORDER — CEFAZOLIN SODIUM-DEXTROSE 2-4 GM/100ML-% IV SOLN
INTRAVENOUS | Status: AC
  Filled 2016-12-24: qty 100

## 2016-12-24 MED ORDER — KETOROLAC TROMETHAMINE 30 MG/ML IJ SOLN
INTRAMUSCULAR | Status: AC
Start: 2016-12-24 — End: ?
  Filled 2016-12-24: qty 1

## 2016-12-24 MED ORDER — MIDAZOLAM HCL 5 MG/5ML IJ SOLN
INTRAMUSCULAR | Status: DC | PRN
  Administered 2016-12-24: 2 mg via INTRAVENOUS

## 2016-12-24 MED ORDER — ONDANSETRON HCL 4 MG/2ML IJ SOLN
INTRAMUSCULAR | Status: DC | PRN
  Administered 2016-12-24: 4 mg via INTRAVENOUS

## 2016-12-24 MED ORDER — SILVER NITRATE-POT NITRATE 75-25 % EX MISC
CUTANEOUS | Status: DC | PRN
  Administered 2016-12-24: 2

## 2016-12-24 MED ORDER — KETOROLAC TROMETHAMINE 30 MG/ML IJ SOLN
INTRAMUSCULAR | Status: DC | PRN
  Administered 2016-12-24: 30 mg via INTRAVENOUS

## 2016-12-24 MED ORDER — PROPOFOL 10 MG/ML IV BOLUS
INTRAVENOUS | Status: AC
  Filled 2016-12-24: qty 40

## 2016-12-24 MED ORDER — ONDANSETRON HCL 4 MG/2ML IJ SOLN
INTRAMUSCULAR | Status: AC
  Filled 2016-12-24: qty 2

## 2016-12-24 MED ORDER — IBUPROFEN 800 MG PO TABS: 800.0000 mg | ORAL_TABLET | Freq: Three times a day (TID) | ORAL | 0 refills | Status: DC | PRN

## 2016-12-24 MED ORDER — EPHEDRINE 5 MG/ML INJ
INTRAVENOUS | Status: AC
  Filled 2016-12-24: qty 10

## 2016-12-24 MED ORDER — PROPOFOL 10 MG/ML IV BOLUS
INTRAVENOUS | Status: DC | PRN
  Administered 2016-12-24: 100 mg via INTRAVENOUS
  Administered 2016-12-24: 200 mg via INTRAVENOUS

## 2016-12-24 MED ORDER — PHENYLEPHRINE HCL 10 MG/ML IJ SOLN
INTRAMUSCULAR | Status: DC | PRN
  Administered 2016-12-24: 120 ug via INTRAVENOUS

## 2016-12-24 MED ORDER — FENTANYL CITRATE (PF) 100 MCG/2ML IJ SOLN
INTRAMUSCULAR | Status: DC | PRN
Start: 2016-12-24 — End: 2016-12-24
  Administered 2016-12-24: 50 ug via INTRAVENOUS

## 2016-12-24 MED ORDER — CEFAZOLIN SODIUM-DEXTROSE 2-4 GM/100ML-% IV SOLN
2.0000 g | INTRAVENOUS | Status: AC
  Administered 2016-12-24: 2 g via INTRAVENOUS
  Filled 2016-12-24: qty 100

## 2016-12-24 MED ORDER — LACTATED RINGERS IV SOLN
INTRAVENOUS | Status: DC
  Administered 2016-12-24 (×2): via INTRAVENOUS
  Filled 2016-12-24: qty 1000

## 2016-12-24 MED ORDER — MIDAZOLAM HCL 2 MG/2ML IJ SOLN
INTRAMUSCULAR | Status: AC
  Filled 2016-12-24: qty 2

## 2016-12-24 MED ORDER — HYDROMORPHONE HCL 2 MG/ML IJ SOLN
INTRAMUSCULAR | Status: AC
  Filled 2016-12-24: qty 1

## 2016-12-24 MED ORDER — HYDROMORPHONE HCL 1 MG/ML IJ SOLN
0.2500 mg | INTRAMUSCULAR | Status: DC | PRN
  Filled 2016-12-24: qty 0.5

## 2016-12-24 MED ORDER — ONDANSETRON HCL 4 MG/2ML IJ SOLN
INTRAMUSCULAR | Status: AC
Start: 2016-12-24 — End: ?
  Filled 2016-12-24: qty 2

## 2016-12-24 SURGICAL SUPPLY — 26 items
ABLATOR ENDOMETRIAL BIPOLAR (ABLATOR) ×2 IMPLANT
CANISTER SUCT 3000ML PPV (MISCELLANEOUS) ×2 IMPLANT
CATH ROBINSON RED A/P 16FR (CATHETERS) ×2 IMPLANT
CLOTH BEACON ORANGE TIMEOUT ST (SAFETY) ×2 IMPLANT
CONTAINER PREFILL 10% NBF 60ML (FORM) IMPLANT
COUNTER NEEDLE 1200 MAGNETIC (NEEDLE) ×2 IMPLANT
DEVICE MYOSURE LITE (MISCELLANEOUS) ×1 IMPLANT
DEVICE MYOSURE REACH (MISCELLANEOUS) IMPLANT
DILATOR CANAL MILEX (MISCELLANEOUS) IMPLANT
ELECT REM PT RETURN 9FT ADLT (ELECTROSURGICAL)
ELECTRODE REM PT RTRN 9FT ADLT (ELECTROSURGICAL) IMPLANT
FILTER ARTHROSCOPY CONVERTOR (FILTER) ×2 IMPLANT
GLOVE BIO SURGEON STRL SZ 6.5 (GLOVE) ×2 IMPLANT
GLOVE BIOGEL PI IND STRL 7.0 (GLOVE) ×2 IMPLANT
GLOVE BIOGEL PI INDICATOR 7.0 (GLOVE) ×2
GOWN STRL REUS W/TWL LRG LVL3 (GOWN DISPOSABLE) ×4 IMPLANT
IV NS IRRIG 3000ML ARTHROMATIC (IV SOLUTION) ×3 IMPLANT
MYOSURE XL FIBROID REM (MISCELLANEOUS)
PACK VAGINAL MINOR WOMEN LF (CUSTOM PROCEDURE TRAY) ×2 IMPLANT
PAD OB MATERNITY 4.3X12.25 (PERSONAL CARE ITEMS) ×2 IMPLANT
PAD PREP 24X48 CUFFED NSTRL (MISCELLANEOUS) ×2 IMPLANT
SEAL ROD LENS SCOPE MYOSURE (ABLATOR) ×2 IMPLANT
SYSTEM TISS REMOVAL MYSR XL RM (MISCELLANEOUS) IMPLANT
TOWEL OR 17X24 6PK STRL BLUE (TOWEL DISPOSABLE) ×4 IMPLANT
TUBING AQUILEX INFLOW (TUBING) ×2 IMPLANT
TUBING AQUILEX OUTFLOW (TUBING) ×2 IMPLANT

## 2016-12-24 NOTE — Op Note (Addendum)
Operative Note  12/24/2016  8:18 AM  PATIENT:  [name]  51 y.o. female  PRE-OPERATIVE DIAGNOSIS:  Progressive refractory menorrhagia  POST-OPERATIVE DIAGNOSIS:  Progressive refractory menorrhagia, Endometrial Polyps  PROCEDURE:  Procedure(s): DILATATION & CURETTAGE/HYSTEROSCOPY WITH MYOSURE RESECTION, HYSTEROSCOPY WITH NOVASURE ENDOMETRIAL ABLATION  SURGEON:  Surgeon(s): [name], MD  ANESTHESIA:   general  FINDINGS:  Small Endometrial Polyps  DESCRIPTION OF OPERATION:  Under general anesthesia with laryngeal mask the patient is in lithotomy position. She is on prepped with Betadine on the supra pubic, vulvar and vaginal areas. The bladder is catheterized. The patient is draped as usual. The vaginal exam reveals an anteverted uterus normal volume no adnexal mass. The speculum is inserted in the vagina and the anterior lip of the cervix is grasped with a tenaculum.  A paracervical block is done with Nesacaine 1% a total of 20 cc at 4 and 8:00.  Measurement of the cervical length is at 5 cm and the hysterometry at 10 cm for uterine cavity length of 5 cm.  Dilation of the cervix with Hegar dilators up to #23 without difficulty.  The hysteroscope is inserted in the intrauterine cavity.  Inspection of the intrauterine.  Both ostia are well seen.  Small polyps are present mainly around the ostia bilaterally.  The hysteroscope is removed. An intra-uterine curettage is done on all surfaces with a sharp curette.  We then go back with the hysteroscope and confirmed that the polyps are still present. A Myosure resection of those polyps is done.  Pictures were taken before and after resection of the polyps.  We then removed the hysteroscope with the Myosure.  We confirmed that the cervix is already dilated to number 27 dilator.  Easy insertion of the NovaSure instrument in the intrauterine cavity.  The width is measured at 4.5 cm.  The security test is passed.  Endometrial ablation with  the NovaSure instrument at a power of 124 W for a duration of 1 minute and 29 seconds.  The NovaSure instrument is removed and we see abundant burned material on the device.  The tenaculum is removed from the cervix. Silver nitrate is applied at that site to complete hemostasis.  The speculum is also removed. The patient is brought to recovery room in good and stable status.  ESTIMATED BLOOD LOSS: 25 cc  FLUID DEFICIT:  95 CC   Intake/Output Summary (Last 24 hours) at 12/24/16 0818 Last data filed at 12/24/16 0800  Gross per 24 hour  Intake                0 ml  Output               75 ml  Net              -75 ml     BLOOD ADMINISTERED:none   LOCAL MEDICATIONS USED:  Paracervical block with Nesacaine 1% 20 cc  SPECIMEN:  Source of Specimen:  Endometrial curetings and resection material of Polyps  DISPOSITION OF SPECIMEN:  PATHOLOGY  COUNTS:  YES  PLAN OF CARE: Transfer to PACU  Marie-Lyne LavoieMD8:18 AM

## 2016-12-24 NOTE — Transfer of Care (Signed)
Immediate Anesthesia Transfer of Care Note  Patient: [name]  Procedure(s) Performed: Procedure(s) with comments: DILATATION & CURETTAGE/HYSTEROSCOPY WITH MYOSURE (N/A) - requesting 7:30am OR time  requests one hour HYSTEROSCOPY WITH NOVASURE (N/A)  Patient Location: PACU  Anesthesia Type:General  Level of Consciousness: awake, alert , oriented and patient cooperative  Airway & Oxygen Therapy: Patient Spontanous Breathing and Patient connected to nasal cannula oxygen  Post-op Assessment: Report given to RN and Post -op Vital signs reviewed and stable  Post vital signs: Reviewed and stable  Last Vitals:  Vitals:   12/24/16 0615  BP: (!) 147/77  Pulse: 94  Resp: 16  Temp: 37.3 C  SpO2: 98%    Last Pain:  Vitals:   12/24/16 0615  TempSrc: Oral      Patients Stated Pain Goal: 3 (56/70/14 1030)  Complications: No apparent anesthesia complications

## 2016-12-24 NOTE — Anesthesia Postprocedure Evaluation (Signed)
Anesthesia Post Note  Patient: TACEY DIMAGGIO  Procedure(s) Performed: Procedure(s) (LRB): DILATATION & CURETTAGE/HYSTEROSCOPY WITH MYOSURE (N/A) HYSTEROSCOPY WITH NOVASURE (N/A)     Patient location during evaluation: PACU Anesthesia Type: General Level of consciousness: awake and alert Pain management: pain level controlled Vital Signs Assessment: post-procedure vital signs reviewed and stable Respiratory status: spontaneous breathing, nonlabored ventilation, respiratory function stable and patient connected to nasal cannula oxygen Cardiovascular status: blood pressure returned to baseline and stable Postop Assessment: no signs of nausea or vomiting Anesthetic complications: no    Last Vitals:  Vitals:   12/24/16 0900 12/24/16 0941  BP: 130/83 129/71  Pulse: 70 77  Resp: 15 16  Temp:  37.6 C  SpO2: 100% 97%    Last Pain:  Vitals:   12/24/16 0900  TempSrc:   PainSc: 2                  Dashawna Delbridge P Anaisabel Pederson

## 2016-12-24 NOTE — H&P (Signed)
[name] is an 51 y.o. female.G26P2A2 Married.  C/S x 2 1986 and 1996.  BT/S 1996.  RP:  Progressively worsening Menorrhagia for HSC/D+C/Novasure Endometrial Ablation  HPI:  Progressively worsening Menorrhagia over the past 2 years, with heavy menses x 2 weeks every month currently.  Didn't improve on Progestin-pill in the past.  Pelvic US 05/2016 showed a thickened Endometrial line at 22+ mm with no focal lesion, no increased BF on Doppler.  An EBx done the same day revealed a benign secretory endometrium, negative for hyperplasia or malignancy.  Patient was offered attempting to control her heavy menses with a Progestin IUD, but opted for Novasure Endometrial ablation.  A HSC/D+C/Novasure Endometrial Ablation procedure was scheduled for 06/2016, but patient had to cancel because of Insurance issues.  She is now ready to proceed with surgery.   Pertinent Gynecological History: Menses: Heavy Contraception: tubal ligation Blood transfusions: none Sexually transmitted diseases: no past history Previous GYN Procedures: BT/S  Last mammogram: normal  Last pap: normal    Menstrual History:  Patient's last menstrual period was 12/20/2016.    Past Medical History:  Diagnosis Date  . Anxiety   . Depression   . H/O total thyroidectomy 1995   for thyoid cancer  . History of adenomatous polyp of colon    tubular adenoma's 01-14-2011;  02-04-2016  . History of eating disorder    depression  . History of ovarian cyst    12-21-2013  at [facility]  lap. w/ lso and right salpingecotmy-- per path left ovary cystic teratoma and bilateral fallopian paratubal cyst's  . History of radiation to head and neck region 1995   thryoid cancer--- pt states no throat/ swallowing issues  . History of supraventricular tachycardia since age 66- had not seen a cardiologist until adulthood   sustained SVT since age 25-- has had ED visit's w/ adenosine and cardioversion in past/  05-10-2013  RFCA of AVNRT  ablation by dr gregg taylor/  12-20-2016 per pt no not any issues, S&S since ablation  . History of thyroid cancer dx 1995--- 12-20-2016 per pt no recurrence and was release from oncologist   s/p  total thyroidectomy and radiation completed 1996  . Hypothyroidism, postsurgical 1995  . Major depressive disorder     Past Surgical History:  Procedure Laterality Date  . APPENDECTOMY  2004  . CESAREAN SECTION  1986 and 1996   Bilateral Tubal Ligation w/ last c/s 1996  . KNEE ARTHROSCOPY W/ ACL RECONSTRUCTION Bilateral 04/ 2015;  2012  . LAPAROSCOPIC SALPINGOOPHERECTOMY  12-21-2013  dr Fermin Schwab at [facility] and right salpingectomy  . LIPOMA EXCISION  1990's ;  10-13-2004 dr Excell Seltzer at [facility]   right neck  . SUPRAVENTRICULAR TACHYCARDIA ABLATION N/A 05/10/2013   Procedure: SUPRAVENTRICULAR TACHYCARDIA ABLATION;  Surgeon: Evans Lance, MD;  Location: [facility] CATH LAB;  Service: Cardiovascular;  Laterality: N/A;  . TOTAL THYROIDECTOMY Bilateral 1995    Family History  Problem Relation Age of Onset  . Alzheimer's disease Mother        currently 84.  . Colon cancer Father 37       currently 8.  . Lung cancer Father   . Alzheimer's disease Maternal Grandmother   . Colon cancer Paternal Grandmother   . Colon cancer Paternal Grandfather   . Stomach cancer Neg Hx     Social History:  reports that she has never smoked. She has never used smokeless tobacco. She reports that she drinks alcohol. She reports  that she does not use drugs.  Allergies:  Allergies  Allergen Reactions  . Codeine Itching    Prescriptions Prior to Admission  Medication Sig Dispense Refill Last Dose  . amphetamine-dextroamphetamine (ADDERALL) 10 MG tablet Take 10 mg by mouth 2 (two) times daily with a meal.    Past Week at Unknown time  . buPROPion (WELLBUTRIN XL) 300 MG 24 hr tablet Take 300 mg by mouth every morning.    12/24/2016 at 0500  . levothyroxine (SYNTHROID, LEVOTHROID) 150 MCG tablet Take 1 tablet  (150 mcg total) by mouth daily before breakfast. Needs office visit with lab work before further refills (Patient taking differently: Take 150 mcg by mouth daily before breakfast. Needs office visit with lab work before further refills) 30 tablet 0 12/24/2016 at 0500  . LORazepam (ATIVAN) 0.5 MG tablet Take 0.5 mg by mouth 2 (two) times daily as needed (for anxiety).   4 Past Month at Unknown time  . norethindrone (AYGESTIN) 5 MG tablet Take 1 tablet (5 mg total) by mouth 3 (three) times daily as needed. (Patient taking differently: Take 5 mg by mouth 3 (three) times daily as needed. Currently per pt taking tid) 30 tablet 2 12/23/2016 at Unknown time  . valACYclovir (VALTREX) 1000 MG tablet Take 1 tablet (1,000 mg total) by mouth 3 (three) times daily. (Patient taking differently: Take 1,000 mg by mouth every 12 (twelve) hours as needed (for onset of cold sores). ) 21 tablet 3 12/23/2016 at Unknown time  . venlafaxine XR (EFFEXOR-XR) 75 MG 24 hr capsule Take 75 mg by mouth daily with breakfast.   6 12/24/2016 at 0500  . zolpidem (AMBIEN) 10 MG tablet Take 10 mg by mouth at bedtime.  5 12/23/2016 at Unknown time    REVIEW OF SYSTEMS: A ROS was performed and pertinent positives and negatives are included in the history.  GENERAL: No fevers or chills. HEENT: No change in vision, no earache, sore throat or sinus congestion. NECK: No pain or stiffness. CARDIOVASCULAR: No chest pain or pressure. No palpitations. PULMONARY: No shortness of breath, cough or wheeze. GASTROINTESTINAL: No abdominal pain, nausea, vomiting or diarrhea, melena or bright red blood per rectum. GENITOURINARY: No urinary frequency, urgency, hesitancy or dysuria. MUSCULOSKELETAL: No joint or muscle pain, no back pain, no recent trauma. DERMATOLOGIC: No rash, no itching, no lesions. ENDOCRINE: No polyuria, polydipsia, no heat or cold intolerance. No recent change in weight. HEMATOLOGICAL: No anemia or easy bruising or bleeding. NEUROLOGIC: No  headache, seizures, numbness, tingling or weakness. PSYCHIATRIC: No depression, no loss of interest in normal activity or change in sleep pattern.     Blood pressure (!) 147/77, pulse 94, temperature 99.2 F (37.3 C), temperature source Oral, resp. rate 16, height 5\' 2"  (1.575 m), weight 224 lb (101.6 kg), last menstrual period 12/20/2016, SpO2 98 %.  Physical Exam:  See office notes  Results for orders placed or performed during the hospital encounter of 12/24/16 (from the past 24 hour(s))  Pregnancy, urine POC     Status: None   Collection Time: 12/24/16  6:57 AM  Result Value Ref Range   Preg Test, Ur NEGATIVE NEGATIVE    Assessment/Plan:  Menorrhagia with regular cycle Pelvic US 05/2016 showed a thickened Endometrial line at 22+ mm with no focal lesion, no increased BF on Doppler.  An EBx 05/2016 revealed a benign secretory endometrium, negative for hyperplasia or malignancy.  Menorrhagia resistant to Progestin-Pill.  Declines Progestin IUD.  Decision to proceed with Hysteroscopy, D+C,  Novasure Endometrial Ablation.  Surgery and risks reviewed thoroughly.                          Patient was counseled as to the risk of surgery to include the following:  1. Infection (prohylactic antibiotics will be administered)  2. DVT/Pulmonary Embolism (prophylactic pneumo compression stockings will be used)  3.Trauma to internal organs requiring additional surgical procedure to repair any injury to     Internal organs requiring perhaps additional hospitalization days.  Patient had received literature information on the procedure scheduled and all her questions were answered and fully accepts all risk.    Marie-Lyne Ichael Pullara 12/24/2016, 7:16 AM

## 2016-12-24 NOTE — Discharge Instructions (Addendum)
Endometrial Ablation Endometrial ablation is a procedure that destroys the thin inner layer of the lining of the uterus (endometrium). This procedure may be done:  To stop heavy periods.  To stop bleeding that is causing anemia.  To control irregular bleeding.  To treat bleeding caused by small tumors (fibroids) in the endometrium.  This procedure is often an alternative to major surgery, such as removal of the uterus and cervix (hysterectomy). As a result of this procedure:  You may not be able to have children. However, if you are premenopausal (you have not gone through menopause): ? You may still have a small chance of getting pregnant. ? You will need to use a reliable method of birth control after the procedure to prevent pregnancy.  You may stop having a menstrual period, or you may have only a small amount of bleeding during your period. Menstruation may return several years after the procedure.  Tell a health care provider about:  Any allergies you have.  All medicines you are taking, including vitamins, herbs, eye drops, creams, and over-the-counter medicines.  Any problems you or family members have had with the use of anesthetic medicines.  Any blood disorders you have.  Any surgeries you have had.  Any medical conditions you have. What are the risks? Generally, this is a safe procedure. However, problems may occur, including:  A hole (perforation) in the uterus or bowel.  Infection of the uterus, bladder, or vagina.  Bleeding.  Damage to other structures or organs.  An air bubble in the lung (air embolus).  Problems with pregnancy after the procedure.  Failure of the procedure.  Decreased ability to diagnose cancer in the endometrium.  What happens before the procedure?  You will have tests of your endometrium to make sure there are no pre-cancerous cells or cancer cells present.  You may have an ultrasound of the uterus.  You may be given  medicines to thin the endometrium.  Ask your health care provider about: ? Changing or stopping your regular medicines. This is especially important if you take diabetes medicines or blood thinners. ? Taking medicines such as aspirin and ibuprofen. These medicines can thin your blood. Do not take these medicines before your procedure if your doctor tells you not to.  Plan to have someone take you home from the hospital or clinic. What happens during the procedure?  You will lie on an exam table with your feet and legs supported as in a pelvic exam.  To lower your risk of infection: ? Your health care team will wash or sanitize their hands and put on germ-free (sterile) gloves. ? Your genital area will be washed with soap.  An IV tube will be inserted into one of your veins.  You will be given a medicine to help you relax (sedative).  A surgical instrument with a light and camera (resectoscope) will be inserted into your vagina and moved into your uterus. This allows your surgeon to see inside your uterus.  Endometrial tissue will be removed using one of the following methods: ? Radiofrequency. This method uses a radiofrequency-alternating electric current to remove the endometrium. ? Cryotherapy. This method uses extreme cold to freeze the endometrium. ? Heated-free liquid. This method uses a heated saltwater (saline) solution to remove the endometrium. ? Microwave. This method uses high-energy microwaves to heat up the endometrium and remove it. ? Thermal balloon. This method involves inserting a catheter with a balloon tip into the uterus. The balloon tip is  filled with heated fluid to remove the endometrium. The procedure may vary among health care providers and hospitals. What happens after the procedure?  Your blood pressure, heart rate, breathing rate, and blood oxygen level will be monitored until the medicines you were given have worn off.  As tissue healing occurs, you may  notice vaginal bleeding for 4-6 weeks after the procedure. You may also experience: ? Cramps. ? Thin, watery vaginal discharge that is light pink or brown in color. ? A need to urinate more frequently than usual. ? Nausea.  Do not drive for 24 hours if you were given a sedative.  Do not have sex or insert anything into your vagina until your health care provider approves. Summary  Endometrial ablation is done to treat the many causes of heavy menstrual bleeding.  The procedure may be done only after medications have been tried to control the bleeding.  Plan to have someone take you home from the hospital or clinic.  Do not take any nonsteroidal anti inflammatories until after 2:15 pm today. This information is not intended to replace advice given to you by your health care provider. Make sure you discuss any questions you have with your health care provider. Document Released: 02/20/2004 Document Revised: 04/29/2016 Document Reviewed: 04/29/2016 Elsevier Interactive Patient Education  2017 [place] [facility] Instructions  Activity: Get plenty of rest for the remainder of the day. A responsible individual must stay with you for 24 hours following the procedure.  For the next 24 hours, DO NOT: -Drive a car -Paediatric nurse -Drink alcoholic beverages -Take any medication unless instructed by your physician -Make any legal decisions or sign important papers.  Meals: Start with liquid foods such as gelatin or soup. Progress to regular foods as tolerated. Avoid greasy, spicy, heavy foods. If nausea and/or vomiting occur, drink only clear liquids until the nausea and/or vomiting subsides. Call your physician if vomiting continues.  Special Instructions/Symptoms: Your throat may feel dry or sore from the anesthesia or the breathing tube placed in your throat during surgery. If this causes discomfort, gargle with warm salt water. The discomfort should  disappear within 24 hours.  If you had a scopolamine patch placed behind your ear for the management of post- operative nausea and/or vomiting:  1. The medication in the patch is effective for 72 hours, after which it should be removed.  Wrap patch in a tissue and discard in the trash. Wash hands thoroughly with soap and water. 2. You may remove the patch earlier than 72 hours if you experience unpleasant side effects which may include dry mouth, dizziness or visual disturbances. 3. Avoid touching the patch. Wash your hands with soap and water after contact with the patch.

## 2016-12-24 NOTE — Anesthesia Procedure Notes (Signed)
Procedure Name: LMA Insertion Date/Time: 12/24/2016 7:38 AM Performed by: Wanita Chamberlain Pre-anesthesia Checklist: Patient identified, Emergency Drugs available, Suction available, Patient being monitored and Timeout performed Patient Re-evaluated:Patient Re-evaluated prior to induction Oxygen Delivery Method: Circle system utilized Preoxygenation: Pre-oxygenation with 100% oxygen Induction Type: IV induction Ventilation: Mask ventilation without difficulty LMA: LMA inserted LMA Size: 4.0 Number of attempts: 1 Placement Confirmation: positive ETCO2 and breath sounds checked- equal and bilateral Tube secured with: Tape Dental Injury: Teeth and Oropharynx as per pre-operative assessment

## 2016-12-24 NOTE — Anesthesia Preprocedure Evaluation (Addendum)
Anesthesia Evaluation  Patient identified by MRN, date of birth, ID band Patient awake    Reviewed: Allergy & Precautions, NPO status , Patient's Chart, lab work & pertinent test results  Airway Mallampati: II  TM Distance: >3 FB Neck ROM: Full    Dental no notable dental hx. (+)    Pulmonary neg pulmonary ROS,    Pulmonary exam normal breath sounds clear to auscultation       Cardiovascular negative cardio ROS Normal cardiovascular exam Rhythm:Regular Rate:Normal  H/o SVT s/p ablation   Neuro/Psych  Headaches, PSYCHIATRIC DISORDERS Anxiety Depression    GI/Hepatic negative GI ROS, Neg liver ROS,   Endo/Other  Hypothyroidism Morbid obesity  Renal/GU negative Renal ROS   progressive refractory menorrhagia    Musculoskeletal negative musculoskeletal ROS (+)   Abdominal   Peds  Hematology  (+) anemia ,   Anesthesia Other Findings Thyroid CA s/p radiation and surgery  Reproductive/Obstetrics                           Anesthesia Physical Anesthesia Plan  ASA: III  Anesthesia Plan: General   Post-op Pain Management:    Induction: Intravenous  PONV Risk Score and Plan: 3 and Ondansetron, Dexamethasone and Midazolam  Airway Management Planned: LMA  Additional Equipment:   Intra-op Plan:   Post-operative Plan: Extubation in OR  Informed Consent: I have reviewed the patients History and Physical, chart, labs and discussed the procedure including the risks, benefits and alternatives for the proposed anesthesia with the patient or authorized representative who has indicated his/her understanding and acceptance.   Dental advisory given  Plan Discussed with: CRNA  Anesthesia Plan Comments:         Anesthesia Quick Evaluation

## 2016-12-28 ENCOUNTER — Encounter (HOSPITAL_BASED_OUTPATIENT_CLINIC_OR_DEPARTMENT_OTHER): Payer: Self-pay | Admitting: Obstetrics & Gynecology

## 2016-12-30 ENCOUNTER — Other Ambulatory Visit: Payer: Self-pay | Admitting: Internal Medicine

## 2017-02-15 ENCOUNTER — Encounter: Payer: Self-pay | Admitting: Anesthesiology

## 2017-09-22 ENCOUNTER — Encounter: Payer: Self-pay | Admitting: Internal Medicine

## 2017-09-22 ENCOUNTER — Other Ambulatory Visit (INDEPENDENT_AMBULATORY_CARE_PROVIDER_SITE_OTHER): Payer: BLUE CROSS/BLUE SHIELD

## 2017-09-22 ENCOUNTER — Other Ambulatory Visit: Payer: Self-pay | Admitting: Internal Medicine

## 2017-09-22 ENCOUNTER — Ambulatory Visit: Payer: BLUE CROSS/BLUE SHIELD | Admitting: Internal Medicine

## 2017-09-22 VITALS — BP 110/80 | HR 74 | Temp 98.1°F | Ht 62.0 in | Wt 229.0 lb

## 2017-09-22 DIAGNOSIS — J029 Acute pharyngitis, unspecified: Secondary | ICD-10-CM | POA: Diagnosis not present

## 2017-09-22 DIAGNOSIS — E039 Hypothyroidism, unspecified: Secondary | ICD-10-CM

## 2017-09-22 LAB — TSH: TSH: 46.24 u[IU]/mL — ABNORMAL HIGH (ref 0.35–4.50)

## 2017-09-22 LAB — COMPREHENSIVE METABOLIC PANEL
ALBUMIN: 4.1 g/dL (ref 3.5–5.2)
ALK PHOS: 79 U/L (ref 39–117)
ALT: 26 U/L (ref 0–35)
AST: 27 U/L (ref 0–37)
BUN: 12 mg/dL (ref 6–23)
CO2: 26 mEq/L (ref 19–32)
Calcium: 9 mg/dL (ref 8.4–10.5)
Chloride: 103 mEq/L (ref 96–112)
Creatinine, Ser: 0.79 mg/dL (ref 0.40–1.20)
GFR: 80.6 mL/min (ref >60.00)
Glucose, Bld: 94 mg/dL (ref 70–99)
POTASSIUM: 3.9 meq/L (ref 3.5–5.1)
Sodium: 137 mEq/L (ref 135–145)
TOTAL PROTEIN: 7.4 g/dL (ref 6.0–8.3)
Total Bilirubin: 0.5 mg/dL (ref 0.2–1.2)

## 2017-09-22 LAB — LIPID PANEL
CHOLESTEROL: 202 mg/dL — AB (ref 0–200)
HDL: 59.7 mg/dL (ref >39.00)
LDL Cholesterol: 119 mg/dL — ABNORMAL HIGH (ref 0–99)
NonHDL: 141.91
Total CHOL/HDL Ratio: 3
Triglycerides: 114 mg/dL (ref 0.0–149.0)
VLDL: 22.8 mg/dL (ref 0.0–40.0)

## 2017-09-22 LAB — CBC
HEMATOCRIT: 44.5 % (ref 36.0–46.0)
HEMOGLOBIN: 14.8 g/dL (ref 12.0–15.0)
MCHC: 33.2 g/dL (ref 30.0–36.0)
MCV: 91.2 fl (ref 78.0–100.0)
Platelets: 261 10*3/uL (ref 150.0–400.0)
RBC: 4.88 Mil/uL (ref 3.87–5.11)
RDW: 13.8 % (ref 11.5–15.5)
WBC: 6.8 10*3/uL (ref 4.0–10.5)

## 2017-09-22 LAB — HEMOGLOBIN A1C: HEMOGLOBIN A1C: 5.2 % (ref 4.6–6.5)

## 2017-09-22 LAB — T4, FREE: Free T4: 0.43 ng/dL — ABNORMAL LOW (ref 0.60–1.60)

## 2017-09-22 MED ORDER — FLUTICASONE PROPIONATE 50 MCG/ACT NA SUSP: 2.0000 | Freq: Every day | NASAL | 6 refills | Status: DC

## 2017-09-22 MED ORDER — SYNTHROID 175 MCG PO TABS: 175.0000 ug | ORAL_TABLET | Freq: Every day | ORAL | 3 refills | Status: DC

## 2017-09-22 NOTE — Patient Instructions (Signed)
We will check the labs and send in the thyroid medicine.   We have sent in flonase to use for the sinuses. Take 2 sprays in each nostril daily to help.

## 2017-09-22 NOTE — Assessment & Plan Note (Signed)
Suspect from post nasal drip at night time. Rx for atrovent nasal spray.

## 2017-09-22 NOTE — Progress Notes (Signed)
   Subjective:    Patient ID: [name], female    DOB: 16-Oct-1965, 52 y.o.   MRN: [id number]  HPI The patient is a 52 YO female coming in for follow up of her thyroid (no labs or visit in 2.5 years, still getting refills of thyroid medicine and last given 30 day supply in August 2018, out for about 1 week now denies heat or cold intolerance, weight is stable, energy is okay), and her weight (weight is increasing and she is wondering about thyroid levels, some sleepiness during the day), and her new problem of sore throat and dry mouth in the morning (she is having drainage for some time, wakes not feeling rested, dry mouth in the morning and some throat congestion, denies cough or SOB, denies fevers or chills, going on for weeks).   Review of Systems  Constitutional: Positive for fatigue. Negative for activity change, appetite change, fever and unexpected weight change.  HENT: Positive for congestion, postnasal drip, rhinorrhea and sore throat. Negative for ear discharge, ear pain, sinus pain, sneezing, tinnitus, trouble swallowing and voice change.   Eyes: Negative.   Respiratory: Negative for cough, chest tightness, shortness of breath and wheezing.   Cardiovascular: Negative.  Negative for chest pain, palpitations and leg swelling.  Gastrointestinal: Negative.  Negative for abdominal distention, abdominal pain, constipation, diarrhea, nausea and vomiting.  Endocrine:       Fatigue, weight gain  Musculoskeletal: Negative.   Skin: Negative.   Neurological: Negative.   Psychiatric/Behavioral: Negative.       Objective:   Physical Exam  Constitutional: She is oriented to person, place, and time. She appears well-developed and well-nourished.  overweight  HENT:  Head: Normocephalic and atraumatic.  Oropharynx with redness and clear drainage  Eyes: EOM are normal.  Neck: Normal range of motion.  Cardiovascular: Normal rate and regular rhythm.  Pulmonary/Chest: Effort normal and  breath sounds normal. No respiratory distress. She has no wheezes. She has no rales.  Abdominal: Soft. Bowel sounds are normal. She exhibits no distension. There is no tenderness. There is no rebound.  Musculoskeletal: She exhibits no edema.  Neurological: She is alert and oriented to person, place, and time. Coordination normal.  Skin: Skin is warm and dry.  Psychiatric: She has a normal mood and affect.   Vitals:   09/22/17 1025  BP: 110/80  Pulse: 74  Temp: 98.1 F (36.7 C)  TempSrc: Oral  SpO2: 98%  Weight: 229 lb (103.9 kg)  Height: 5\' 2"  (1.575 m)      Assessment & Plan:

## 2017-09-22 NOTE — Assessment & Plan Note (Signed)
Needs labs prior to rx, she wants to try brand synthroid as she has heard from friends that this makes you feel better. Dose as appropriate based on labs.

## 2017-09-22 NOTE — Assessment & Plan Note (Signed)
Checking lipid panel and XWN7I for complication. Possible OSA if atrovent does not help her dry mouth she may need sleep study.

## 2017-12-17 ENCOUNTER — Other Ambulatory Visit: Payer: Self-pay | Admitting: Internal Medicine

## 2018-07-07 ENCOUNTER — Other Ambulatory Visit: Payer: Self-pay | Admitting: Internal Medicine

## 2018-07-07 MED ORDER — VALACYCLOVIR HCL 1 G PO TABS: 1000.0000 mg | ORAL_TABLET | Freq: Two times a day (BID) | ORAL | 0 refills | Status: DC | PRN

## 2018-07-07 NOTE — Telephone Encounter (Signed)
Pt is calling back and would like valtrex to [facility] in [place] instead of [facility] in [place] due to cost

## 2018-07-07 NOTE — Telephone Encounter (Signed)
Requested medication (s) are due for refill today: yes  Requested medication (s) are on the active medication list: yes  Last refill:  11/03/15 #21 with 3 refills  Future visit scheduled: No  Notes to clinic:  Pt requesting refill of medication without office visit. LOV 09/22/17. Last prescription written in 2017    Requested Prescriptions  Pending Prescriptions Disp Refills   valACYclovir (VALTREX) 1000 MG tablet 21 tablet 3    Sig: Take 1 tablet (1,000 mg total) by mouth 3 (three) times daily.     Antimicrobials:  Antiviral Agents - Anti-Herpetic Passed - 07/07/2018 10:14 AM      Passed - Valid encounter within last 12 months    Recent Outpatient Visits          9 months ago Acquired hypothyroidism   [facility], Elizabeth A, MD   2 years ago Chronic upper back pain   [facility] -Georges Mouse, MD   3 years ago Routine general medical examination at a health care facility   [facility], Elizabeth A, MD   3 years ago Rash and nonspecific skin eruption   [facility] -Chuck Hint, MD   3 years ago [facility] -Chuck Hint, MD

## 2018-07-07 NOTE — Telephone Encounter (Signed)
Copied from [place] [id number]. Topic: Quick Communication - Rx Refill/Question >> Jul 07, 2018 10:00 AM Sheran Luz wrote: Medication:  valACYclovir (VALTREX) 1000 MG tablet    Patient is requesting refill of this medication w/o office visit. Pt is currently having cold sores.   Preferred Pharmacy (with phone number or street name):[id number] - [place], [place] [address]  [phone] (Phone) [phone] (Fax)

## 2018-07-11 ENCOUNTER — Other Ambulatory Visit: Payer: Self-pay | Admitting: Internal Medicine

## 2018-08-10 ENCOUNTER — Other Ambulatory Visit: Payer: Self-pay | Admitting: Internal Medicine

## 2018-08-14 ENCOUNTER — Other Ambulatory Visit: Payer: Self-pay | Admitting: Internal Medicine

## 2018-11-09 ENCOUNTER — Other Ambulatory Visit: Payer: Self-pay | Admitting: Internal Medicine

## 2018-11-29 ENCOUNTER — Other Ambulatory Visit: Payer: Self-pay | Admitting: Internal Medicine

## 2018-12-28 ENCOUNTER — Ambulatory Visit (INDEPENDENT_AMBULATORY_CARE_PROVIDER_SITE_OTHER): Payer: 59 | Admitting: Internal Medicine

## 2018-12-28 ENCOUNTER — Encounter: Payer: Self-pay | Admitting: Internal Medicine

## 2018-12-28 ENCOUNTER — Other Ambulatory Visit: Payer: Self-pay

## 2018-12-28 VITALS — BP 110/80 | HR 82 | Temp 97.5°F | Ht 62.0 in | Wt 254.0 lb

## 2018-12-28 DIAGNOSIS — Z23 Encounter for immunization: Secondary | ICD-10-CM | POA: Diagnosis not present

## 2018-12-28 DIAGNOSIS — Z0001 Encounter for general adult medical examination with abnormal findings: Secondary | ICD-10-CM

## 2018-12-28 DIAGNOSIS — Z0000 Encounter for general adult medical examination without abnormal findings: Secondary | ICD-10-CM

## 2018-12-28 DIAGNOSIS — E039 Hypothyroidism, unspecified: Secondary | ICD-10-CM

## 2018-12-28 DIAGNOSIS — L918 Other hypertrophic disorders of the skin: Secondary | ICD-10-CM | POA: Diagnosis not present

## 2018-12-28 DIAGNOSIS — B001 Herpesviral vesicular dermatitis: Secondary | ICD-10-CM

## 2018-12-28 MED ORDER — SYNTHROID 175 MCG PO TABS: 175.0000 ug | ORAL_TABLET | Freq: Every day | ORAL | 3 refills | Status: DC

## 2018-12-28 MED ORDER — VALACYCLOVIR HCL 1 G PO TABS: 1000.0000 mg | ORAL_TABLET | Freq: Two times a day (BID) | ORAL | 3 refills | Status: DC | PRN

## 2018-12-28 NOTE — Assessment & Plan Note (Signed)
Checking TSH and free T4, off synthroid so will hold off on adjustment if abnormal.

## 2018-12-28 NOTE — Assessment & Plan Note (Addendum)
Weight is up 20 pounds, given information about webinar for weight loss clinic as she wishes to pursue weight loss procedure. She has tried many diets over the years without success. Home sleep test ordered for morning headaches and fatigue.

## 2018-12-28 NOTE — Patient Instructions (Addendum)
Webinar for weight loss [email or web address]  Thoracic Strain Rehab Ask your health care provider which exercises are safe for you. Do exercises exactly as told by your health care provider and adjust them as directed. It is normal to feel mild stretching, pulling, tightness, or discomfort as you do these exercises. Stop right away if you feel sudden pain or your pain gets worse. Do not begin these exercises until told by your health care provider. Stretching and range-of-motion exercise This exercise warms up your muscles and joints and improves the movement and flexibility of your back and shoulders. This exercise also helps to relieve pain. Chest and spine stretch  1. Lie down on your back on a firm surface. 2. Roll a towel or a small blanket so it is about 4 inches (10 cm) in diameter. 3. Put the towel lengthwise under the middle of your back so it is under your spine, but not under your shoulder blades. 4. Put your hands behind your head and let your elbows fall to your sides. This will increase your stretch. 5. Take a deep breath (inhale). 6. Hold for __________ seconds. 7. Relax after you breathe out (exhale). Repeat __________ times. Complete this exercise __________ times a day. Strengthening exercises These exercises build strength and endurance in your back and your shoulder blade muscles. Endurance is the ability to use your muscles for a long time, even after they get tired. Alternating arm and leg raises  1. Get on your hands and knees on a firm surface. If you are on a hard floor, you may want to use padding, such as an exercise mat, to cushion your knees. 2. Line up your arms and legs. Your hands should be directly below your shoulders, and your knees should be directly below your hips. 3. Lift your left leg behind you. At the same time, raise your right arm and straighten it in front of you. ? Do not lift your leg higher than your hip. ? Do not lift  your arm higher than your shoulder. ? Keep your abdominal and back muscles tight. ? Keep your hips facing the ground. ? Do not arch your back. ? Keep your balance carefully, and do not hold your breath. 4. Hold for __________ seconds. 5. Slowly return to the starting position and repeat with your right leg and your left arm. Repeat __________ times. Complete this exercise __________ times a day. Straight arm rows This exercise is also called shoulder extension exercise. 1. Stand with your feet shoulder width apart. 2. Secure an exercise band to a stable object in front of you so the band is at or above shoulder height. 3. Hold one end of the exercise band in each hand. 4. Straighten your elbows and lift your hands up to shoulder height. 5. Step back, away from the secured end of the exercise band, until the band stretches. 6. Squeeze your shoulder blades together and pull your hands down to the sides of your thighs. Stop when your hands are straight down by your sides. This is shoulder extension. Do not let your hands go behind your body. 7. Hold for __________ seconds. 8. Slowly return to the starting position. Repeat __________ times. Complete this exercise __________ times a day. Prone shoulder external rotation 1. Lie on your abdomen on a firm bed so your left / right forearm hangs over the edge of the bed and your upper arm is on the bed, straight out from your body. This is the prone position. ?  Your elbow should be bent. ? Your palm should be facing your feet. 2. If instructed, hold a __________ weight in your hand. 3. Squeeze your shoulder blade toward the middle of your back. Do not let your shoulder lift toward your ear. 4. Keep your elbow bent in a 90-degree angle (right angle) while you slowly move your forearm up toward the ceiling. Move your forearm up to the height of the bed, toward your head. This is external rotation. ? Your upper arm should not move. ? At the top of the  movement, your palm should face the floor. 5. Hold for __________ seconds. 6. Slowly return to the starting position and relax your muscles. Repeat __________ times. Complete this exercise __________ times a day. Rowing scapular retraction This is an exercise in which the shoulder blades (scapulae) are pulled toward each other (retraction). 1. Sit in a stable chair without armrests, or stand up. 2. Secure an exercise band to a stable object in front of you so the band is at shoulder height. 3. Hold one end of the exercise band in each hand. Your palms should face down. 4. Bring your arms out straight in front of you. 5. Step back, away from the secured end of the exercise band, until the band stretches. 6. Pull the band backward. As you do this, bend your elbows and squeeze your shoulder blades together, but avoid letting the rest of your body move. Do not shrug your shoulders upward while you do this. 7. Stop when your elbows are at your sides or slightly behind your body. 8. Hold for __________ seconds. 9. Slowly straighten your arms to return to the starting position. Repeat __________ times. Complete this exercise __________ times a day. Posture and body mechanics Good posture and healthy body mechanics can help to relieve stress in your body's tissues and joints. Body mechanics refers to the movements and positions of your body while you do your daily activities. Posture is part of body mechanics. Good posture means:  Your spine is in its natural S-curve position (neutral).  Your shoulders are pulled back slightly.  Your head is not tipped forward. Follow these guidelines to improve your posture and body mechanics in your everyday activities. Standing   When standing, keep your spine neutral and your feet about hip width apart. Keep a slight bend in your knees. Your ears, shoulders, and hips should line up with each other.  When you do a task in which you lean forward while  standing in one place for a long time, place one foot up on a stable object that is 2-4 inches (5-10 cm) high, such as a footstool. This helps keep your spine neutral. Sitting   When sitting, keep your spine neutral and keep your feet flat on the floor. Use a footrest, if necessary, and keep your thighs parallel to the floor. Avoid rounding your shoulders, and avoid tilting your head forward.  When working at a desk or a computer, keep your desk at a height where your hands are slightly lower than your elbows. Slide your chair under your desk so you are close enough to maintain good posture.  When working at a computer, place your monitor at a height where you are looking straight ahead and you do not have to tilt your head forward or downward to look at the screen. Resting When lying down and resting, avoid positions that are most painful for you.  If you have pain with activities such as sitting,  bending, stooping, or squatting (flexion-basedactivities), lie in a position in which your body does not bend very much. For example, avoid curling up on your side with your arms and knees near your chest (fetal position).  If you have pain with activities such as standing for a long time or reaching with your arms (extension-basedactivities), lie with your spine in a neutral position and bend your knees slightly. Try the following positions: ? Lie on your side with a pillow between your knees. ? Lie on your back with a pillow under your knees.  Lifting   When lifting objects, keep your feet at least shoulder width apart and tighten your abdominal muscles.  Bend your knees and hips and keep your spine neutral. It is important to lift using the strength of your legs, not your back. Do not lock your knees straight out.  Always ask for help to lift heavy or awkward objects. This information is not intended to replace advice given to you by your health care provider. Make sure you discuss any  questions you have with your health care provider. Document Released: 04/12/2005 Document Revised: 08/04/2018 Document Reviewed: 05/22/2018 Elsevier Patient Education  2020 [place] Maintenance, Female Adopting a healthy lifestyle and getting preventive care are important in promoting health and wellness. Ask your health care provider about:  The right schedule for you to have regular tests and exams.  Things you can do on your own to prevent diseases and keep yourself healthy. What should I know about diet, weight, and exercise? Eat a healthy diet   Eat a diet that includes plenty of vegetables, fruits, low-fat dairy products, and lean protein.  Do not eat a lot of foods that are high in solid fats, added sugars, or sodium. Maintain a healthy weight Body mass index (BMI) is used to identify weight problems. It estimates body fat based on height and weight. Your health care provider can help determine your BMI and help you achieve or maintain a healthy weight. Get regular exercise Get regular exercise. This is one of the most important things you can do for your health. Most adults should:  Exercise for at least 150 minutes each week. The exercise should increase your heart rate and make you sweat (moderate-intensity exercise).  Do strengthening exercises at least twice a week. This is in addition to the moderate-intensity exercise.  Spend less time sitting. Even light physical activity can be beneficial. Watch cholesterol and blood lipids Have your blood tested for lipids and cholesterol at 55 years of age, then have this test every 5 years. Have your cholesterol levels checked more often if:  Your lipid or cholesterol levels are high.  You are older than 55 years of age.  You are at high risk for heart disease. What should I know about cancer screening? Depending on your health history and family history, you may need to have cancer screening at various ages. This  may include screening for:  Breast cancer.  Cervical cancer.  Colorectal cancer.  Skin cancer.  Lung cancer. What should I know about heart disease, diabetes, and high blood pressure? Blood pressure and heart disease  High blood pressure causes heart disease and increases the risk of stroke. This is more likely to develop in people who have high blood pressure readings, are of African descent, or are overweight.  Have your blood pressure checked: ? Every 3-5 years if you are 42-38 years of age. ? Every year if you are 22 years old  or older. Diabetes Have regular diabetes screenings. This checks your fasting blood sugar level. Have the screening done:  Once every three years after age 83 if you are at a normal weight and have a low risk for diabetes.  More often and at a younger age if you are overweight or have a high risk for diabetes. What should I know about preventing infection? Hepatitis B If you have a higher risk for hepatitis B, you should be screened for this virus. Talk with your health care provider to find out if you are at risk for hepatitis B infection. Hepatitis C Testing is recommended for:  Everyone born from [birth year] through 1967.  Anyone with known risk factors for hepatitis C. Sexually transmitted infections (STIs)  Get screened for STIs, including gonorrhea and chlamydia, if: ? You are sexually active and are younger than 55 years of age. ? You are older than 55 years of age and your health care provider tells you that you are at risk for this type of infection. ? Your sexual activity has changed since you were last screened, and you are at increased risk for chlamydia or gonorrhea. Ask your health care provider if you are at risk.  Ask your health care provider about whether you are at high risk for HIV. Your health care provider may recommend a prescription medicine to help prevent HIV infection. If you choose to take medicine to prevent HIV, you should  first get tested for HIV. You should then be tested every 3 months for as long as you are taking the medicine. Pregnancy  If you are about to stop having your period (premenopausal) and you may become pregnant, seek counseling before you get pregnant.  Take 400 to 800 micrograms (mcg) of folic acid every day if you become pregnant.  Ask for birth control (contraception) if you want to prevent pregnancy. Osteoporosis and menopause Osteoporosis is a disease in which the bones lose minerals and strength with aging. This can result in bone fractures. If you are 16 years old or older, or if you are at risk for osteoporosis and fractures, ask your health care provider if you should:  Be screened for bone loss.  Take a calcium or vitamin D supplement to lower your risk of fractures.  Be given hormone replacement therapy (HRT) to treat symptoms of menopause. Follow these instructions at home: Lifestyle  Do not use any products that contain nicotine or tobacco, such as cigarettes, e-cigarettes, and chewing tobacco. If you need help quitting, ask your health care provider.  Do not use street drugs.  Do not share needles.  Ask your health care provider for help if you need support or information about quitting drugs. Alcohol use  Do not drink alcohol if: ? Your health care provider tells you not to drink. ? You are pregnant, may be pregnant, or are planning to become pregnant.  If you drink alcohol: ? Limit how much you use to 0-1 drink a day. ? Limit intake if you are breastfeeding.  Be aware of how much alcohol is in your drink. In the [place] one drink equals one 12 oz bottle of beer (355 mL), one 5 oz glass of wine (148 mL), or one 1 oz glass of hard liquor (44 mL). General instructions  Schedule regular health, dental, and eye exams.  Stay current with your vaccines.  Tell your health care provider if: ? You often feel depressed. ? You have ever been abused or do not feel  safe  at home. Summary  Adopting a healthy lifestyle and getting preventive care are important in promoting health and wellness.  Follow your health care provider's instructions about healthy diet, exercising, and getting tested or screened for diseases.  Follow your health care provider's instructions on monitoring your cholesterol and blood pressure. This information is not intended to replace advice given to you by your health care provider. Make sure you discuss any questions you have with your health care provider. Document Released: 10/26/2010 Document Revised: 04/05/2018 Document Reviewed: 04/05/2018 Elsevier Patient Education  2020 [employer].

## 2018-12-28 NOTE — Progress Notes (Signed)
Patient ID: [name], female   DOB: 02-19-1966, 53 y.o.   MRN: [id number] Skin Tag Removal Procedure Note  Pre-operative Diagnosis: Classic skin tags (acrochordon)  Post-operative Diagnosis: Classic skin tags (acrochordon)  Locations:left, collarbone region  Indications: pain, catching on clothes  Anesthesia: ethyl chloride spray    Procedure Details  The risks (including bleeding and infection) and benefits of the procedure and Verbal informed consent obtained. Using sterile iris scissors, multiple skin tags were snipped off at their bases after cleansing with alcohol.  Bleeding was controlled by pressure.   Findings: Pathognomonic benign lesions  not sent for pathological exam.  Condition: Stable  Complications: none.  Plan: 1. Instructed to keep the wounds dry and covered for 24-48h and clean thereafter. 2. Warning signs of infection were reviewed.   3. Recommended that the patient use ice as needed for pain.  4. Return as needed.

## 2018-12-28 NOTE — Assessment & Plan Note (Signed)
Refill valtrex as severe. Takes all the time for prevention.

## 2018-12-28 NOTE — Progress Notes (Signed)
   Subjective:   Patient ID: [name], female    DOB: December 11, 1965, 53 y.o.   MRN: [id number]  HPI The patient is a 53 YO female coming in for physical. Wants skin tag off. Having several other concerns, maybe interested in weight loss surgery.  PMH, [facility], social history reviewed and updated  Review of Systems  Constitutional: Negative.   HENT: Negative.   Eyes: Negative.   Respiratory: Negative for cough, chest tightness and shortness of breath.   Cardiovascular: Negative for chest pain, palpitations and leg swelling.  Gastrointestinal: Negative for abdominal distention, abdominal pain, constipation, diarrhea, nausea and vomiting.  Musculoskeletal: Negative.   Skin: Negative.   Neurological: Positive for headaches.  Psychiatric/Behavioral: Negative.        Morning fatigue, daytime drowsiness    Objective:  Physical Exam Constitutional:      Appearance: She is well-developed. She is obese.  HENT:     Head: Normocephalic and atraumatic.  Neck:     Musculoskeletal: Normal range of motion.  Cardiovascular:     Rate and Rhythm: Normal rate and regular rhythm.  Pulmonary:     Effort: Pulmonary effort is normal. No respiratory distress.     Breath sounds: Normal breath sounds. No wheezing or rales.  Abdominal:     General: Bowel sounds are normal. There is no distension.     Palpations: Abdomen is soft.     Tenderness: There is no abdominal tenderness. There is no rebound.  Skin:    General: Skin is warm and dry.     Comments: Skin tag removed left collar bone region per separate note  Neurological:     Mental Status: She is alert and oriented to person, place, and time.     Coordination: Coordination normal.     Vitals:   12/28/18 1426  BP: 110/80  Pulse: 82  Temp: (!) 97.5 F (36.4 C)  TempSrc: Oral  SpO2: 99%  Weight: 254 lb (115.2 kg)  Height: 5\' 2"  (1.575 m)    Assessment & Plan:  Flu and shingrix given at visit

## 2018-12-28 NOTE — Assessment & Plan Note (Signed)
Removed after verbal consent and discussion about risk and benefit. She elects removal and done and tolerated well.

## 2018-12-28 NOTE — Assessment & Plan Note (Signed)
Flu shot given. Shingrix given 1st today. Tetanus up to date. Colonoscopy up to date. Mammogram ordered today, pap smear up to date with gyn need records [facility]. Counseled about sun safety and mole surveillance. Counseled about the dangers of distracted driving. Given 10 year screening recommendations.

## 2019-01-11 ENCOUNTER — Encounter: Payer: Self-pay | Admitting: Internal Medicine

## 2019-01-11 NOTE — Progress Notes (Signed)
Abstracted and sent to scan  

## 2019-01-15 ENCOUNTER — Telehealth: Payer: Self-pay

## 2019-01-15 NOTE — Telephone Encounter (Signed)
Copied from [place] [id number]. Topic: General - Other >> Jan 15, 2019  2:26 PM Pauline Good wrote: Reason for CRM: pt want to know the status of her Sleep Apnea test scheduling. Pt stated no one has reached out to her to sched

## 2019-01-15 NOTE — Telephone Encounter (Signed)
Do you know anything about this? 

## 2019-01-16 ENCOUNTER — Other Ambulatory Visit: Payer: Self-pay | Admitting: Internal Medicine

## 2019-01-16 DIAGNOSIS — R0683 Snoring: Secondary | ICD-10-CM

## 2019-01-17 NOTE — Telephone Encounter (Signed)
Noted thank you

## 2019-01-17 NOTE — Telephone Encounter (Signed)
Referral sent to [facility] - spoke to pt and gave her their #

## 2019-01-30 ENCOUNTER — Other Ambulatory Visit: Payer: Self-pay

## 2019-01-30 ENCOUNTER — Encounter: Payer: Self-pay | Admitting: Neurology

## 2019-01-30 ENCOUNTER — Ambulatory Visit (INDEPENDENT_AMBULATORY_CARE_PROVIDER_SITE_OTHER): Payer: 59 | Admitting: Neurology

## 2019-01-30 VITALS — BP 139/84 | HR 102 | Ht 62.0 in | Wt 261.0 lb

## 2019-01-30 DIAGNOSIS — R635 Abnormal weight gain: Secondary | ICD-10-CM

## 2019-01-30 DIAGNOSIS — R519 Headache, unspecified: Secondary | ICD-10-CM | POA: Diagnosis not present

## 2019-01-30 DIAGNOSIS — G4719 Other hypersomnia: Secondary | ICD-10-CM | POA: Diagnosis not present

## 2019-01-30 DIAGNOSIS — R0683 Snoring: Secondary | ICD-10-CM

## 2019-01-30 DIAGNOSIS — R351 Nocturia: Secondary | ICD-10-CM

## 2019-01-30 DIAGNOSIS — Z6841 Body Mass Index (BMI) 40.0 and over, adult: Secondary | ICD-10-CM

## 2019-01-30 NOTE — Progress Notes (Signed)
Subjective:    Patient ID: [name] is a 53 y.o. female.  HPI     Star Age, MD, PhD [facility] [address] [place], [place] [address]  Dear Dr. Sharlet Salina,  I saw your patient, [name], upon your kind request in my sleep clinic today for initial consultation of her sleep disorder, in particular, concern for underlying obstructive sleep apnea.  Patient is unaccompanied today.  As you know, Ms. Pereyra is a 53 year old right-handed woman with an underlying medical history of supraventricular tachycardia, history of thyroid cancer, status post total thyroidectomy and radiation, depression, anxiety and morbid obesity with a BMI of over 45, who reports snoring and excessive daytime somnolence.  She has had recurrent morning headaches.  She has had mental fogginess.  She reports feeling sluggish in the morning and having a hard time getting started in the mornings.  She has tried Ambien in the past but stopped it about a year ago because she felt groggy in the daytime and did not like the feeling of relying on a sleep aid nightly. I reviewed your office note from 12/28/2018.  Her Epworth sleepiness score is 13 out of 24, fatigue severity score is 59 out of 63.  She wakes up with her uvula swollen, she has mouth breathing.  She has no obvious family history of OSA.  She has nocturia about twice per average night.  Her morning headaches are dull and achy, not severe and she does not typically take any medication for this.  She lives with her husband, they do have 2 dogs in the household who stay in a kennel in their bedroom at night, not on the bed.  There is a TV in the bedroom but she rarely watches it and if they turn it on at night they turn it off before falling asleep.  She has had steady weight gain over the past few years.  She has thought about getting evaluated for bariatric surgery.  She has not felt rested for at least 2 years and has been  snoring for longer.  She has 2 grown children, 1 daughter, 1 son, their son is in the [employer] and currently staying with them.  She is a non-smoker and drinks green tea, about 2 bottles per day, occasional alcohol every other weekend or so.  Bedtime and rise time vary.  She works as an Optometrist.   His Past Medical History Is Significant For: Past Medical History:  Diagnosis Date  . Anxiety   . Depression   . H/O total thyroidectomy 1995   for thyoid cancer  . History of adenomatous polyp of colon    tubular adenoma's 01-14-2011;  02-04-2016  . History of eating disorder    depression  . History of ovarian cyst    12-21-2013  at [facility]  lap. w/ lso and right salpingecotmy-- per path left ovary cystic teratoma and bilateral fallopian paratubal cyst's  . History of radiation to head and neck region 1995   thryoid cancer--- pt states no throat/ swallowing issues  . History of supraventricular tachycardia since age 35- had not seen a cardiologist until adulthood   sustained SVT since age 89-- has had ED visit's w/ adenosine and cardioversion in past/  05-10-2013  RFCA of AVNRT ablation by dr gregg taylor/  12-20-2016 per pt no not any issues, S&S since ablation  . History of thyroid cancer dx 1995--- 12-20-2016 per pt no recurrence and was release from  oncologist   s/p  total thyroidectomy and radiation completed 1996  . Hypothyroidism, postsurgical 1995  . Major depressive disorder     His Past Surgical History Is Significant For: Past Surgical History:  Procedure Laterality Date  . APPENDECTOMY  2004  . CESAREAN SECTION  1986 and 1996   Bilateral Tubal Ligation w/ last c/s 1996  . DILATATION & CURETTAGE/HYSTEROSCOPY WITH MYOSURE N/A 12/24/2016   Procedure: DILATATION & CURETTAGE/HYSTEROSCOPY WITH MYOSURE;  Surgeon: [name], MD;  Location: [place];  Service: Gynecology;  Laterality: N/A;  requesting 7:30am OR time  requests one hour  . HYSTEROSCOPY WITH  NOVASURE N/A 12/24/2016   Procedure: HYSTEROSCOPY WITH NOVASURE;  Surgeon: [name], MD;  Location: [place];  Service: Gynecology;  Laterality: N/A;  . KNEE ARTHROSCOPY W/ ACL RECONSTRUCTION Bilateral 04/ 2015;  2012  . LAPAROSCOPIC SALPINGOOPHERECTOMY  12-21-2013  dr Fermin Schwab at [facility] and right salpingectomy  . LIPOMA EXCISION  1990's ;  10-13-2004 dr Excell Seltzer at [facility]   right neck  . SUPRAVENTRICULAR TACHYCARDIA ABLATION N/A 05/10/2013   Procedure: SUPRAVENTRICULAR TACHYCARDIA ABLATION;  Surgeon: Evans Lance, MD;  Location: [facility] CATH LAB;  Service: Cardiovascular;  Laterality: N/A;  . TOTAL THYROIDECTOMY Bilateral 1995    His Family History Is Significant For: Family History  Problem Relation Age of Onset  . Alzheimer's disease Mother        currently 72.  . Colon cancer Father 83       currently 61.  . Lung cancer Father   . Alzheimer's disease Maternal Grandmother   . Colon cancer Paternal Grandmother   . Colon cancer Paternal Grandfather   . Stomach cancer Neg Hx     His Social History Is Significant For: Social History   Socioeconomic History  . Marital status: Married    Spouse name: Not on file  . Number of children: Not on file  . Years of education: Not on file  . Highest education level: Not on file  Occupational History  . Not on file  Social Needs  . Financial resource strain: Not on file  . Food insecurity    Worry: Not on file    Inability: Not on file  . Transportation needs    Medical: Not on file    Non-medical: Not on file  Tobacco Use  . Smoking status: Never Smoker  . Smokeless tobacco: Never Used  . Tobacco comment: Married, lives with spouse. Works as Nurse, learning disability and Sexual Activity  . Alcohol use: Yes    Comment: seldom  . Drug use: No  . Sexual activity: Yes  Lifestyle  . Physical activity    Days per week: Not on file    Minutes per session: Not on file  . Stress: Not on file   Relationships  . Social Herbalist on phone: Not on file    Gets together: Not on file    Attends religious service: Not on file    Active member of club or organization: Not on file    Attends meetings of clubs or organizations: Not on file    Relationship status: Not on file  Other Topics Concern  . Not on file  Social History Narrative   Lives in [place] with husband and son.  Chemical engineer.    His Allergies Are:  Allergies  Allergen Reactions  . Codeine Itching  :   His Current Medications Are:  Outpatient Encounter  Medications as of 01/30/2019  Medication Sig  . amphetamine-dextroamphetamine (ADDERALL) 10 MG tablet Take 10 mg by mouth 2 (two) times daily with a meal.   . buPROPion (WELLBUTRIN XL) 300 MG 24 hr tablet Take 300 mg by mouth every morning.   . fluticasone (FLONASE) 50 MCG/ACT nasal spray Place 2 sprays into both nostrils daily.  Marland Kitchen SYNTHROID 175 MCG tablet Take 1 tablet (175 mcg total) by mouth daily before breakfast. Needs annual appointment for further refills  . valACYclovir (VALTREX) 1000 MG tablet Take 1 tablet (1,000 mg total) by mouth every 12 (twelve) hours as needed (for onset of cold sores).  . venlafaxine XR (EFFEXOR-XR) 75 MG 24 hr capsule Take 75 mg by mouth daily with breakfast.   . [DISCONTINUED] ibuprofen (ADVIL,MOTRIN) 800 MG tablet Take 1 tablet (800 mg total) by mouth every 8 (eight) hours as needed.  . [DISCONTINUED] LORazepam (ATIVAN) 0.5 MG tablet Take 0.5 mg by mouth 2 (two) times daily as needed (for anxiety).   . [DISCONTINUED] zolpidem (AMBIEN) 10 MG tablet Take 10 mg by mouth at bedtime.   No facility-administered encounter medications on file as of 01/30/2019.   :  Review of Systems:  Out of a complete 14 point review of systems, all are reviewed and negative with the exception of these symptoms as listed below: Review of Systems  Neurological:       Pt presents today to discuss her sleep. Pt has never had a sleep  study but does endorse snoring.  Epworth Sleepiness Scale 0= would never doze 1= slight chance of dozing 2= moderate chance of dozing 3= high chance of dozing  Sitting and reading: 3 Watching TV: 3 Sitting inactive in a public place (ex. Theater or meeting): 0 As a passenger in a car for an hour without a break: 1 Lying down to rest in the afternoon: 3 Sitting and talking to someone: 0 Sitting quietly after lunch (no alcohol): 3 In a car, while stopped in traffic: 0 Total: 13     Objective:  Neurological Exam  Physical Exam Physical Examination:   Vitals:   01/30/19 1604  BP: 139/84  Pulse: (!) 102    General Examination: The patient is a very pleasant 53 y.o. female in no acute distress. She appears well-developed and well-nourished and well groomed.   HEENT: Normocephalic, atraumatic, pupils are equal, round and reactive to light and accommodation. Extraocular tracking is good without limitation to gaze excursion or nystagmus noted. Normal smooth pursuit is noted. Hearing is grossly intact. Face is symmetric with normal facial animation and normal facial sensation. Speech is clear with no dysarthria noted. There is no hypophonia. There is no lip, neck/head, jaw or voice tremor. Neck is supple with full range of passive and active motion. There are no carotid bruits on auscultation. Oropharynx exam reveals: mild mouth dryness, adequate dental hygiene and mild airway crowding, due to a smaller airway entry, tonsils of 1+ on the R and 1-2+ on the L. Mallampati is class II. Tongue protrudes centrally and palate elevates symmetrically. Neck size is 15.75 inches. She has a mild to moderate overbite. Nasal inspection reveals no significant nasal mucosal bogginess or redness and no septal deviation.   Chest: Clear to auscultation without wheezing, rhonchi or crackles noted.  Heart: S1+S2+0, regular and normal without murmurs, rubs or gallops noted.   Abdomen: Soft, non-tender and  non-distended with normal bowel sounds appreciated on auscultation.  Extremities: There is no pitting edema in the distal lower  extremities bilaterally.  Skin: Warm and dry without trophic changes note.  Musculoskeletal: exam reveals no obvious joint deformities, tenderness or joint swelling or erythema.   Neurologically:  Mental status: The patient is awake, alert and oriented in all 4 spheres. Her immediate and remote memory, attention, language skills and fund of knowledge are appropriate. There is no evidence of aphasia, agnosia, apraxia or anomia. Speech is clear with normal prosody and enunciation. Thought process is linear. Mood is normal and affect is normal.  Cranial nerves II - XII are as described above under HEENT exam.  Motor exam: Normal bulk, strength and tone is noted. There is no tremor. Fine motor skills and coordination: intact with normal finger taps, normal hand movements, normal rapid alternating patting, normal foot taps and normal foot agility.  Cerebellar testing: No dysmetria or intention tremor on finger to nose testing. Heel to shin is unremarkable bilaterally. There is no truncal or gait ataxia.  Sensory exam: intact to light touch in the upper and lower extremities.  Gait, station and balance: She stands easily. No veering to one side is noted. No leaning to one side is noted. Posture is age-appropriate and stance is narrow based. Gait shows normal stride length and normal pace. No problems turning are noted.                Assessment and Plan:  In summary, JAZLEE IHRIG is a very pleasant 53 y.o.-year old female with an underlying medical history of supraventricular tachycardia, history of thyroid cancer, status post total thyroidectomy and radiation, depression, anxiety and morbid obesity with a BMI of over 52, whose history and physical exam are concerning for obstructive sleep apnea (OSA). I had a long chat with the patient about my findings and the diagnosis of  OSA, its prognosis and treatment options. We talked about medical treatments, surgical interventions and non-pharmacological approaches. I explained in particular the risks and ramifications of untreated moderate to severe OSA, especially with respect to developing cardiovascular disease down the Road, including congestive heart failure, difficult to treat hypertension, cardiac arrhythmias, or stroke. Even type 2 diabetes has, in part, been linked to untreated OSA. Symptoms of untreated OSA include daytime sleepiness, memory problems, mood irritability and mood disorder such as depression and anxiety, lack of energy, as well as recurrent headaches, especially morning headaches. We talked about trying to maintain a healthy lifestyle in general, as well as the importance of weight control. We also talked about the importance of good sleep hygiene. I recommended the following at this time: sleep study.   I explained the sleep test procedure to the patient and also outlined possible surgical and non-surgical treatment options of OSA, including the use of a custom-made dental device (which would require a referral to a specialist dentist or oral surgeon), upper airway surgical options, such as traditional UPPP or a novel less invasive surgical option in the form of Inspire hypoglossal nerve stimulation (which would involve a referral to an ENT surgeon). I also explained the CPAP treatment option to the patient, who indicated that she would be willing to try CPAP if the need arises. I explained the importance of being compliant with PAP treatment, not only for insurance purposes but primarily to improve Her symptoms, and for the patient's long term health benefit, including to reduce Her cardiovascular risks. I answered all her questions today and the patient was in agreement. I plan to see her back after the sleep study is completed and encouraged to call  with any interim questions, concerns, problems or updates.    Thank you very much for allowing me to participate in the care of this nice patient. If I can be of any further assistance to you please do not hesitate to call me at [phone].  Sincerely,   Star Age, MD, PhD

## 2019-01-30 NOTE — Patient Instructions (Signed)

## 2019-02-12 ENCOUNTER — Encounter: Payer: Self-pay | Admitting: Internal Medicine

## 2019-02-19 ENCOUNTER — Ambulatory Visit (INDEPENDENT_AMBULATORY_CARE_PROVIDER_SITE_OTHER): Payer: 59 | Admitting: Neurology

## 2019-02-19 DIAGNOSIS — G4733 Obstructive sleep apnea (adult) (pediatric): Secondary | ICD-10-CM

## 2019-02-19 DIAGNOSIS — R519 Headache, unspecified: Secondary | ICD-10-CM

## 2019-02-19 DIAGNOSIS — G4719 Other hypersomnia: Secondary | ICD-10-CM

## 2019-02-19 DIAGNOSIS — R635 Abnormal weight gain: Secondary | ICD-10-CM

## 2019-02-19 DIAGNOSIS — R351 Nocturia: Secondary | ICD-10-CM

## 2019-02-19 DIAGNOSIS — R0683 Snoring: Secondary | ICD-10-CM

## 2019-02-19 LAB — HEPATIC FUNCTION PANEL
ALT: 19 (ref 7–35)
AST: 17 (ref 13–35)
Alkaline Phosphatase: 80 (ref 25–125)
Bilirubin, Total: 0.3

## 2019-02-19 LAB — LIPID PANEL
Cholesterol: 171 (ref 0–200)
HDL: 42 (ref 35–70)
LDL Cholesterol: 101
Triglycerides: 189 — AB (ref 40–160)

## 2019-02-19 LAB — VITAMIN D 25 HYDROXY (VIT D DEFICIENCY, FRACTURES): Vit D, 25-Hydroxy: 44

## 2019-02-19 LAB — TSH: TSH: 0.08 — AB (ref 0.41–5.90)

## 2019-02-19 LAB — HEMOGLOBIN A1C: Hemoglobin A1C: 5.1

## 2019-02-19 LAB — BASIC METABOLIC PANEL
BUN: 12 (ref 4–21)
Creatinine: 0.9 (ref 0.5–1.1)
Glucose: 87
Potassium: 4.5 (ref 3.4–5.3)
Sodium: 140 (ref 137–147)

## 2019-02-19 LAB — CBC AND DIFFERENTIAL
HCT: 45 (ref 36–46)
Hemoglobin: 14.6 (ref 12.0–16.0)
Neutrophils Absolute: 3815
Platelets: 238 (ref 150–399)
WBC: 6.6

## 2019-02-19 LAB — COMPREHENSIVE METABOLIC PANEL
Albumin: 4.2 (ref 3.5–5.0)
Globulin: 2.6

## 2019-02-19 LAB — CBC: RBC: 4.87 (ref 3.87–5.11)

## 2019-02-20 ENCOUNTER — Other Ambulatory Visit: Payer: Self-pay | Admitting: General Surgery

## 2019-02-20 ENCOUNTER — Other Ambulatory Visit (HOSPITAL_COMMUNITY): Payer: Self-pay | Admitting: General Surgery

## 2019-02-22 ENCOUNTER — Other Ambulatory Visit: Payer: Self-pay

## 2019-02-22 ENCOUNTER — Telehealth: Payer: Self-pay

## 2019-02-22 ENCOUNTER — Ambulatory Visit
Admission: RE | Admit: 2019-02-22 | Discharge: 2019-02-22 | Disposition: A | Discharge status: Home / Self Care | Payer: 59 | Source: Ambulatory Visit | Attending: Internal Medicine | Admitting: Internal Medicine

## 2019-02-22 DIAGNOSIS — Z0000 Encounter for general adult medical examination without abnormal findings: Secondary | ICD-10-CM

## 2019-02-22 NOTE — Progress Notes (Signed)
Patient referred by Dr. Sharlet Salina, seen by me on 01/30/19, HST on 02/20/19.    Please call and notify the patient that the recent home sleep test showed obstructive sleep apnea in the severe range. While I recommend treatment for this in the form CPAP, her insurance will not approve a sleep study for this. They will likely only approve a trial of autoPAP, which means, that we don't have to bring her in for a sleep study with CPAP, but will let start using a so called autoPAP machine at home, through a DME company (of her choice, or as per insurance requirement). The DME representative will educate her on how to use the machine, how to put the mask on, etc. I have placed an order in the chart. Please send referral, talk to patient, send report to referring MD. We will need a FU in sleep clinic for 10 weeks post-PAP set up, please arrange that with me or one of our NPs. Thanks,   Star Age, MD, PhD [facility])

## 2019-02-22 NOTE — Telephone Encounter (Signed)
-----   Message from Star Age, MD sent at 02/22/2019  8:07 AM EDT ----- Patient referred by Dr. Sharlet Salina, seen by me on 01/30/19, HST on 02/20/19.    Please call and notify the patient that the recent home sleep test showed obstructive sleep apnea in the severe range. While I recommend treatment for this in the form CPAP, her insurance will not approve a sleep study for this. They will likely only approve a trial of autoPAP, which means, that we don't have to bring her in for a sleep study with CPAP, but will let start using a so called autoPAP machine at home, through a DME company (of her choice, or as per insurance requirement). The DME representative will educate her on how to use the machine, how to put the mask on, etc. I have placed an order in the chart. Please send referral, talk to patient, send report to referring MD. We will need a FU in sleep clinic for 10 weeks post-PAP set up, please arrange that with me or one of our NPs. Thanks,   Star Age, MD, PhD [facility])

## 2019-02-22 NOTE — Addendum Note (Signed)
Addended by: Star Age on: 02/22/2019 08:07 AM   Modules accepted: Orders

## 2019-02-22 NOTE — Telephone Encounter (Signed)
I called pt. I advised pt that Dr. Rexene Alberts reviewed their sleep study results and found that pt has severe osa. Dr. Rexene Alberts recommends that pt start an auto pap at home. I reviewed PAP compliance expectations with the pt. Pt is agreeable to starting an auto-PAP. I advised pt that an order will be sent to a DME, [facility], and [facility] will call the pt within about one week after they file with the pt's insurance. [facility] will show the pt how to use the machine, fit for masks, and troubleshoot the auto-PAP if needed. A follow up appt was made for insurance purposes with Amy, NP on 05/07/19 at 2:30pm. Pt verbalized understanding to arrive 15 minutes early and bring their auto-PAP. A letter with all of this information in it will be mailed to the pt as a reminder. I verified with the pt that the address we have on file is correct. Pt verbalized understanding of results. Pt had no questions at this time but was encouraged to call back if questions arise. I have sent the order to [facility] and have received confirmation that they have received the order.

## 2019-02-22 NOTE — Procedures (Signed)
Patient Information     First Name: Hailey Last Name: York ID: [id number]  Birth Date: 1965/12/23 Age: 53 Gender: Female  Referring Provider: Hoyt Koch, MD BMI: 47.9 (W=260 lb, H=5' 2'')  Neck Circ.:  16 '' Epworth:  13/24   Sleep Study Information    Study Date: Feb 20, 2019 S/H/A Version: 001.001.001.001 / 4.1.1528 / 30  History:    53 year old woman with a history of supraventricular tachycardia, history of thyroid cancer, status post total thyroidectomy and radiation, depression, anxiety and morbid obesity with a BMI of over 45, who reports snoring and excessive daytime somnolence.  She has had recurrent morning headaches and complains of mental fogginess.  Summary & Diagnosis:    OSA Recommendations:     This home sleep test demonstrates severe obstructive sleep apnea with a total AHI of 43.4/hour and O2 nadir of 81%. Treatment with positive airway pressure (in the form of CPAP) is recommended. This will require a full night CPAP titration study for proper treatment settings, O2 monitoring and mask fitting. Based on the severity of the sleep disordered breathing an attended titration study is indicated. However, patient's insurance has denied an attended sleep study; therefore, the patient will be advised to proceed with an autoPAP titration/trial at home for now. Please note that untreated obstructive sleep apnea may carry additional perioperative morbidity. Patients with significant obstructive sleep apnea should receive perioperative PAP therapy and the surgeons and particularly the anesthesiologist should be informed of the diagnosis and the severity of the sleep disordered breathing. The patient should be cautioned not to drive, work at heights, or operate dangerous or heavy equipment when tired or sleepy. Review and reiteration of good sleep hygiene measures should be pursued with any patient. Other causes of the patient's symptoms, including circadian rhythm disturbances, an  underlying mood disorder, medication effect and/or an underlying medical problem cannot be ruled out based on this test. Clinical correlation is recommended. The patient and her referring provider will be notified of the test results. The patient will be seen in follow up in sleep clinic at [facility].  I certify that I have reviewed the raw data recording prior to the issuance of this report in accordance with the standards of the American Academy of Sleep Medicine (AASM).  Star Age, MD, PhD [facility]) Diplomat, ABPN (Neurology and Sleep)             Sleep Summary    Oxygen Saturation Statistics     Start Study Time: End Study Time: Total Recording Time:     12:00:07 AM 9:01:07 AM      9 h, 1 min  Total Sleep Time % REM of Sleep Time:  7 h, 39 min  22.6    Mean: 94 Minimum: 81 Maximum: 100  Mean of Desaturations Nadirs (%):   91  Oxygen Desaturation. %:   4-9 10-20 >20 Total  Events Number Total   223  14 94.1 5.9  0 0.0  237 100.0  Oxygen Saturation: <90 <=88 <85 <80 <70  Duration (minutes): Sleep % 4.8 1.0  2.4 0.0  0.5 0.0 0.0 0.0 0.0 0.0     Respiratory Indices      Total Events REM NREM All Night  pRDI:  320  pAHI:  315 ODI:  237  pAHIc:  30  % CSR: 0.0 61.2 60.6 54.1 7.1 38.9 38.2 26.2 3.2 44.1 43.4 32.7 4.1       Pulse Rate  Statistics during Sleep (BPM)      Mean: 86 Minimum: 50  Maximum: 117    Indices are calculated using technically valid sleep time of  7 hrs, 15 min. pRDI/pAHI are calculated using oxi desaturations ? 3%  Body Position Statistics  Position Supine Prone Right Left Non-Supine  Sleep (min) 242.0 53.5 115.5 48.5 217.5  Sleep % 52.7 11.6 25.1 10.6 47.3  pRDI 50.0 36.9 37.2 37.4 37.2  pAHI 48.7 36.9 37.2 37.4 37.2  ODI 37.0 30.0 27.5 24.9 27.6     Snoring Statistics Snoring Level (dB) >40 >50 >60 >70 >80 >Threshold (45)  Sleep (min) 356.5 80.3 2.0 0.1 0.0 173.1  Sleep % 77.6 17.5 0.4  0.0 0.0 37.7    Mean: 45 dB Sleep Stages Chart                                                                  pAHI=43.4                                                                              Mild              Moderate                    Severe                                                 5              15                    30

## 2019-02-23 ENCOUNTER — Encounter: Payer: Self-pay | Admitting: Cardiovascular Disease

## 2019-02-23 ENCOUNTER — Encounter: Payer: Self-pay | Admitting: Dietician

## 2019-02-23 ENCOUNTER — Encounter: Payer: 59 | Attending: General Surgery | Admitting: Dietician

## 2019-02-23 ENCOUNTER — Other Ambulatory Visit: Payer: Self-pay

## 2019-02-23 ENCOUNTER — Ambulatory Visit: Payer: 59 | Admitting: Cardiovascular Disease

## 2019-02-23 VITALS — BP 123/77 | HR 84 | Temp 97.0°F | Ht 61.5 in | Wt 261.4 lb

## 2019-02-23 DIAGNOSIS — I471 Supraventricular tachycardia: Secondary | ICD-10-CM | POA: Diagnosis not present

## 2019-02-23 DIAGNOSIS — E669 Obesity, unspecified: Secondary | ICD-10-CM | POA: Diagnosis not present

## 2019-02-23 DIAGNOSIS — Z01810 Encounter for preprocedural cardiovascular examination: Secondary | ICD-10-CM | POA: Diagnosis not present

## 2019-02-23 NOTE — Progress Notes (Signed)
Cardiology Office Note:   Date:  02/23/2019  NAME:  [name]    MRN: [id number] DOB:  August 20, 1965   PCP:  Hoyt Koch, MD  Cardiologist:  No primary care provider on file.   Referring MD: Greer Pickerel, MD   Chief Complaint  Patient presents with  . Pre-op Exam   History of Present Illness:   [name] is a 53 y.o. female with a hx of depression, anxiety, hypothyroidism, AVNRT status post ablation who is being seen today for the evaluation of preoperative examination at the request of Greer Pickerel, MD.  She will have an upcoming gastric bypass surgery with Dr. Greer Pickerel, MD ([facility] [place] [facility], fax [phone] attention Dr. Greer Pickerel).  She reports she is doing quite well.  She is recently been diagnosed with sleep apnea and will soon be started on her machine.  She has no history of hypertension, diabetes, coronary disease, heart failure, CKD, prior stroke.  She is relatively inactive but able to climb 1-2 flights of stairs without major limitations.  She does have arthritis she does state this limits her activity level.  Nonetheless she can exert herself and do activities of daily living without chest pain or shortness of breath.  She endorses no symptoms of heart failure including orthopnea, lower extremity edema, PND.  She is not diabetic and most recent lab values are within normal limits.  She does have hypothyroidism and is status post thyroidectomy, but with supplementation.  She appears to be doing well with this.  Her EKG demonstrates normal sinus rhythm without any acute ST-T changes and no evidence of prior infarction.  Overall she is doing quite well since her SVT ablation and has had no further recurrence of her arrhythmias or palpitations.  Past Medical History: Past Medical History:  Diagnosis Date  . Anxiety   . Depression   . H/O total thyroidectomy 1995   for thyoid cancer  . History of adenomatous polyp of colon    tubular  adenoma's 01-14-2011;  02-04-2016  . History of eating disorder    depression  . History of ovarian cyst    12-21-2013  at [facility]  lap. w/ lso and right salpingecotmy-- per path left ovary cystic teratoma and bilateral fallopian paratubal cyst's  . History of radiation to head and neck region 1995   thryoid cancer--- pt states no throat/ swallowing issues  . History of supraventricular tachycardia since age 34- had not seen a cardiologist until adulthood   sustained SVT since age 75-- has had ED visit's w/ adenosine and cardioversion in past/  05-10-2013  RFCA of AVNRT ablation by dr gregg taylor/  12-20-2016 per pt no not any issues, S&S since ablation  . History of thyroid cancer dx 1995--- 12-20-2016 per pt no recurrence and was release from oncologist   s/p  total thyroidectomy and radiation completed 1996  . Hypothyroidism, postsurgical 1995  . Major depressive disorder     Past Surgical History: Past Surgical History:  Procedure Laterality Date  . APPENDECTOMY  2004  . CESAREAN SECTION  1986 and 1996   Bilateral Tubal Ligation w/ last c/s 1996  . DILATATION & CURETTAGE/HYSTEROSCOPY WITH MYOSURE N/A 12/24/2016   Procedure: DILATATION & CURETTAGE/HYSTEROSCOPY WITH MYOSURE;  Surgeon: [name], MD;  Location: [place];  Service: Gynecology;  Laterality: N/A;  requesting 7:30am OR time  requests one hour  . HYSTEROSCOPY WITH NOVASURE N/A 12/24/2016   Procedure: HYSTEROSCOPY WITH NOVASURE;  Surgeon: Dellis Filbert,  Truddie Hidden, MD;  Location: [place];  Service: Gynecology;  Laterality: N/A;  . KNEE ARTHROSCOPY W/ ACL RECONSTRUCTION Bilateral 04/ 2015;  2012  . LAPAROSCOPIC SALPINGOOPHERECTOMY  12-21-2013  dr Fermin Schwab at [facility] and right salpingectomy  . LIPOMA EXCISION  1990's ;  10-13-2004 dr Excell Seltzer at [facility]   right neck  . SUPRAVENTRICULAR TACHYCARDIA ABLATION N/A 05/10/2013   Procedure: SUPRAVENTRICULAR TACHYCARDIA ABLATION;   Surgeon: Evans Lance, MD;  Location: [facility] CATH [facility];  Service: Cardiovascular;  Laterality: N/A;  . TOTAL THYROIDECTOMY Bilateral 1995    Current Medications: Current Meds  Medication Sig  . amphetamine-dextroamphetamine (ADDERALL) 10 MG tablet Take 10 mg by mouth 2 (two) times daily with a meal.   . buPROPion (WELLBUTRIN XL) 300 MG 24 hr tablet Take 300 mg by mouth every morning.   . fluticasone (FLONASE) 50 MCG/ACT nasal spray Place 2 sprays into both nostrils daily.  Marland Kitchen SYNTHROID 175 MCG tablet Take 1 tablet (175 mcg total) by mouth daily before breakfast. Needs annual appointment for further refills  . valACYclovir (VALTREX) 1000 MG tablet Take 1 tablet (1,000 mg total) by mouth every 12 (twelve) hours as needed (for onset of cold sores).  . venlafaxine XR (EFFEXOR-XR) 75 MG 24 hr capsule Take 75 mg by mouth daily with breakfast.      Allergies:    Codeine   Social History: Social History   Socioeconomic History  . Marital status: Married    Spouse name: Not on file  . Number of children: 2  . Years of education: Not on file  . Highest education level: Not on file  Occupational History  . Not on file  Social Needs  . Financial resource strain: Not on file  . Food insecurity    Worry: Not on file    Inability: Not on file  . Transportation needs    Medical: Not on file    Non-medical: Not on file  Tobacco Use  . Smoking status: Never Smoker  . Smokeless tobacco: Never Used  . Tobacco comment: Married, lives with spouse. Works as Nurse, learning disability and Sexual Activity  . Alcohol use: Yes    Comment: seldom  . Drug use: No  . Sexual activity: Yes  Lifestyle  . Physical activity    Days per week: Not on file    Minutes per session: Not on file  . Stress: Not on file  Relationships  . Social Herbalist on phone: Not on file    Gets together: Not on file    Attends religious service: Not on file    Active member of club or organization: Not on file     Attends meetings of clubs or organizations: Not on file    Relationship status: Not on file  Other Topics Concern  . Not on file  Social History Narrative   Lives in [place] with husband and son.  Chemical engineer.     Family History: The patient's family history includes Alzheimer's disease in her maternal grandmother and mother; Colon cancer in her paternal grandfather and paternal grandmother; Colon cancer (age of onset: 44) in her father; Lung cancer in her father. There is no history of Stomach cancer.  ROS:   All other ROS reviewed and negative. Pertinent positives noted in the HPI.     EKGs/Labs/Other Studies Reviewed:   The following studies were personally reviewed by me today:  Recent lab data demonstrates LDL cholesterol 101, HDL  42, total cholesterol 171, triglycerides 189, A1c 5.1, creatinine 0.79, TSH 0.08  EKG:  EKG is ordered today.  The ekg ordered today demonstrates normal sinus rhythm, heart rate 85, RSR prime pattern (normal), no acute ST-T changes, no evidence of prior infarction, normal intervals, normal EKG, and was personally reviewed by me.   Recent Labs: No results found for requested labs within last 8760 hours.   Recent Lipid Panel    Component Value Date/Time   CHOL 202 (H) 09/22/2017 1058   TRIG 114.0 09/22/2017 1058   TRIG 76 10/31/2008   HDL 59.70 09/22/2017 1058   CHOLHDL 3 09/22/2017 1058   VLDL 22.8 09/22/2017 1058   LDLCALC 119 (H) 09/22/2017 1058    Physical Exam:   VS:  BP 123/77 (BP Location: Right Arm)   Pulse 84   Temp (!) 97 F (36.1 C)   Ht 5' 1.5" (1.562 m)   Wt 261 lb 6.4 oz (118.6 kg)   SpO2 97%   BMI 48.59 kg/m    Wt Readings from Last 3 Encounters:  02/23/19 261 lb 6.4 oz (118.6 kg)  02/23/19 259 lb 8 oz (117.7 kg)  01/30/19 261 lb (118.4 kg)    General: Well nourished, well developed, in no acute distress Heart: Atraumatic, normal size  Eyes: PEERLA, EOMI  Neck: Supple, no JVD Endocrine: No thryomegaly  Cardiac: Normal S1, S2; RRR; no murmurs, rubs, or gallops Lungs: Clear to auscultation bilaterally, no wheezing, rhonchi or rales  Abd: Soft, nontender, no hepatomegaly  Ext: No edema, pulses 2+ Musculoskeletal: No deformities, BUE and BLE strength normal and equal Skin: Warm and dry, no rashes   Neuro: Alert and oriented to person, place, time, and situation, CNII-XII grossly intact, no focal deficits  Psych: Normal mood and affect   ASSESSMENT:   [name] is a 53 y.o. female who presents for the following: 1. Preoperative cardiovascular examination   2. SVT (supraventricular tachycardia) (HCC)     PLAN:   1. Preoperative cardiovascular examination - The Revised Cardiac Risk Index = 0 which equates to 0.4% estimated risk of perioperative myocardial infarction, pulmonary edema, ventricular fibrillation, cardiac arrest, or complete heart block.  -She is able to complete greater than 4 METS without chest pain or trouble breathing. - No further cardiac testing is recommended prior to surgery.  - The patient may proceed to surgery at acceptable risk.    - Our service is available as needed in the peri-operative period.    2. SVT (supraventricular tachycardia) (HCC) -No further recurrence of her arrhythmia.  AVNRT ablation in 2017. -We will see her on a yearly basis.  Disposition: Return in about 1 year (around 02/23/2020).  Medication Adjustments/Labs and Tests Ordered: Current medicines are reviewed at length with the patient today.  Concerns regarding medicines are outlined above.  Orders Placed This Encounter  Procedures  . EKG 12-Lead   No orders of the defined types were placed in this encounter.   Patient Instructions  Medication Instructions:  No changes  Lab Work: Not needed  Testing/Procedures: Not needed  Follow-Up: At [facility], you and your health needs are our priority.  As part of our continuing mission to provide you with exceptional heart  care, we have created designated Provider Care Teams.  These Care Teams include your primary Cardiologist (physician) and Advanced Practice Providers (APPs -  Physician Assistants and Nurse Practitioners) who all work together to provide you with the care you need, when you need it.  Your next appointment:   12 months  The format for your next appointment:   In Person  Provider:   Eleonore Chiquito, MD  Other Instructions  cardiac clearance for upcoming surgery --    Signed, Addison Naegeli. Audie Box, [place]  [address] [place], [place] [address] [phone]  02/23/2019 5:39 PM

## 2019-02-23 NOTE — Patient Instructions (Signed)
Medication Instructions:  No changes  Lab Work: Not needed  Testing/Procedures: Not needed  Follow-Up: At [facility], you and your health needs are our priority.  As part of our continuing mission to provide you with exceptional heart care, we have created designated Provider Care Teams.  These Care Teams include your primary Cardiologist (physician) and Advanced Practice Providers (APPs -  Physician Assistants and Nurse Practitioners) who all work together to provide you with the care you need, when you need it.  Your next appointment:   12 months  The format for your next appointment:   In Person  Provider:   Eleonore Chiquito, MD  Other Instructions  cardiac clearance for upcoming surgery --

## 2019-02-23 NOTE — Patient Instructions (Signed)
Remember to start working on 1 or 2 Pre-Op Goals, such as tracking what you eat and drink or working towards eating/drinking something in the morning before breakfast (before lunchtime.)    See you next month!

## 2019-02-23 NOTE — Progress Notes (Signed)
Nutrition Assessment for Bariatric Surgery Medical Nutrition Therapy  Appt Start Time: 11:20am    End Time: 12:05pm  Patient was seen on 02/23/2019 for Pre-Operative Nutrition Assessment. Letter of approval faxed to [facility] bariatric surgery program coordinator on 02/23/2019.   Referral stated Supervised Weight Loss (SWL) visits needed: 3  Planned surgery: RYGB Pt expectation of surgery: to have a "reset" and become healthier, prevent weight gain as ages, be more active and able to do more    NUTRITION ASSESSMENT   Anthropometrics  Start weight at [facility]: 259.5 lbs (date: 02/23/2019) Height: 61.5 in BMI: 48.2 kg/m2    Clinical  Medical hx: obesity, DOE, hypercholesterolemia, sleep apnea, anxiety, depression, thyroid cancer, oophorectomy, appendectomy, c-section, knee, oral, thyroid, colon polyp removal  Medications: bupropion, levothyroxine, venlafaxine     Lifestyle & Dietary Hx Patient is very personable. Lives with her husband. Has a 56 year old handicapped granddaughter she helps care for.   Typical meal pattern is 2 large meals a day and snacking at night. States she has no appetite in the morning, does not eat until lunch. Recently she found out she has sleep apnea, experiences morning headaches and nausea. Husband does food shopping and cooking, will make extra for pt to take for lunch at work the next day. For lunch/dinner may have salisbury steak and mashed potatoes w/ gravy, roast beef with carrots and potatoes. Doesn't drink milk or soft drinks. Doesn't care for sweets, but likes potatoes, pasta, and salty foods such as chips and salsa.    24-Hr Dietary Recall First Meal: -  Snack: -  Second Meal: chicken parmesan w/ spaghetti  Snack: - Third Meal: salisbury steak + mashed potatoes w/ gravy  Snack: chips + salsa  Beverages: water w/ Mio, Lipton diet green tea, unsweet tea  Estimated Energy Needs Calories: 1600 Carbohydrate: 180g Protein: 100g Fat:  53g   NUTRITION DIAGNOSIS  Overweight/obesity ([place]-3.3) related to past poor dietary habits and physical inactivity as evidenced by patient w/ planned RYGB surgery following dietary guidelines for continued weight loss.    NUTRITION INTERVENTION  Nutrition counseling (C-1) and education (E-2) to facilitate bariatric surgery goals.  Pre-Op Goals Reviewed with the Patient . Track food and beverage intake (pen and paper, MyFitness Pal, Baritastic app, etc.) . Make healthy food choices while monitoring portion sizes . Consume 3 meals per day or try to eat every 3-5 hours . Avoid concentrated sugars and fried foods . Keep sugar & fat in the single digits per serving on food labels . Practice CHEWING your food (aim for applesauce consistency) . Practice not drinking 15 minutes before, during, and 30 minutes after each meal and snack . Avoid all carbonated beverages (ex: soda, sparkling beverages)  . Limit caffeinated beverages (ex: coffee, tea, energy drinks) . Avoid all sugar-sweetened beverages (ex: regular soda, sports drinks)  . Avoid alcohol  . Aim for 64-100 ounces of FLUID daily (with at least half of fluid intake being plain water)  . Aim for at least 60-80 grams of PROTEIN daily . Look for a liquid protein source that contains ?15 g protein and ?5 g carbohydrate (ex: shakes, drinks, shots) . Make a list of non-food related activities . Physical activity is an important part of a healthy lifestyle so keep it moving! The goal is to reach 150 minutes of exercise per week, including cardiovascular and weight baring activity.  *Goals that are bolded indicate the pt would like to start working towards these  Handouts Provided  Include  . Bariatric Surgery handouts (Nutrition Visits, Pre-Op Goals, Protein Shakes, Vitamins & Minerals)  Learning Style & Readiness for Change Teaching method utilized: Visual & Auditory  Demonstrated degree of understanding via: Teach Back  Barriers to  learning/adherence to lifestyle change: None Identified   RD's Notes for Next Visit . Review food logs . Ask about breakfast . Pick up with discussing Pre-Op Goals starting with concentrated sugars / fried foods    MONITORING & EVALUATION Dietary intake, weekly physical activity, body weight, and pre-op goals reached at next nutrition visit.   Next Steps Patient is to return to [facility] in 1 month for 1st SWL Visit.  Patient is to call [facility] to enroll in Pre-Op Class (>2 weeks before surgery) and Post-Op Class (2 weeks after surgery) for further nutrition education when surgery date is scheduled.

## 2019-02-28 ENCOUNTER — Encounter (HOSPITAL_COMMUNITY): Payer: Self-pay

## 2019-02-28 ENCOUNTER — Ambulatory Visit (HOSPITAL_COMMUNITY): Admission: RE | Admit: 2019-02-28 | Payer: 59 | Source: Ambulatory Visit

## 2019-02-28 ENCOUNTER — Ambulatory Visit (HOSPITAL_COMMUNITY): Payer: 59

## 2019-03-05 ENCOUNTER — Other Ambulatory Visit: Payer: Self-pay

## 2019-03-05 ENCOUNTER — Ambulatory Visit (INDEPENDENT_AMBULATORY_CARE_PROVIDER_SITE_OTHER): Payer: 59

## 2019-03-05 DIAGNOSIS — Z23 Encounter for immunization: Secondary | ICD-10-CM

## 2019-03-05 DIAGNOSIS — Z299 Encounter for prophylactic measures, unspecified: Secondary | ICD-10-CM

## 2019-03-14 ENCOUNTER — Encounter: Payer: Self-pay | Admitting: Internal Medicine

## 2019-03-14 NOTE — Progress Notes (Signed)
Abstracted and sent to scan  

## 2019-03-19 ENCOUNTER — Other Ambulatory Visit: Payer: Self-pay

## 2019-03-19 ENCOUNTER — Encounter: Payer: 59 | Attending: General Surgery | Admitting: Skilled Nursing Facility1

## 2019-03-19 DIAGNOSIS — E669 Obesity, unspecified: Secondary | ICD-10-CM | POA: Diagnosis not present

## 2019-03-19 NOTE — Progress Notes (Signed)
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 Pt reports working 60 hour weeks. Does not take breaks, eats at her desk. Thinks her job should be a 2 person job. Pt doesn't feel that she has time for self-care. Pt reports ignoring her body's queues while at work. Reports needing more organization to be able to take a break at work. Patient is a Scientist, research (physical sciences). Pt. Reports she will be working from home after surgery. Says she is going to need a lot of support from husband to be successful. He is already very supportive (makes her meals, packs her lunches). Reports her husband still cooks enough for an entire family, even though they are "empty nesters". Pt. Reports liking very few vegetables (dislikes the taste). Reports having GI distress (gas) when consuming raw vegetables. She is willing to make changes, try cooked green beans, carrots.  NUTRITION ASSESSMENT  Anthropometrics  Start weight at [facility]: 259.5 lbs (date: 02/23/2019) Today's weight: 257.4 lbs Weight change: -2.1 lbs (since previous visit on 02/23/2019) BMI: 47.85 kg/m2    Clinical  Medical Hx: Sleep Apnea Medications: See list Labs:  Notable Signs/Symptoms: None reported  Lifestyle & Dietary Hx  Does not eat until 10-11 am Evening meals are usually "heavy" (Meat and potatoes, pasta). Reports eating leftovers the next day. Considers pasta to be a "bad" food. Recognizes how these foods make here feel.  Estimated daily fluid intake:  Supplements: None reported GI / other notable symptoms: Gas from raw vegetables. Current average weekly physical activity: ADLs  24-Hr Dietary Recall First Meal: (10-11 am) 2 eggs w/ ham, and onion Snack: none reported Second Meal: Leftovers from  previous dinner Snack: none reported Third Meal: Spaghetti and meatballs Snack: none reported Beverages:   Estimated Energy Needs Calories: 1600 Carbohydrate: 180g Protein: 100g Fat: 53g   NUTRITION DIAGNOSIS  Overweight/obesity ([place]-3.3) related to past poor dietary habits and physical inactivity as evidenced by patient w/ planned RYGB surgery following dietary guidelines for continued weight loss.   NUTRITION INTERVENTION  Nutrition counseling (C-1) and education (E-2) to facilitate bariatric surgery goals.   Educate patient on importance of ability to self-care pre and post-surgery.   Counsel patient on looking for ways to increase ability to self-care.  Educate patient on importance of consistent meal intake.  Educate patient on importance of consistent calcium intake.  Pre-Op Goals Progress & New Goals . Take a 30 minute lunch break at work, away from desk. . Talk with husband about how can I delegate more at work to free up responsibilities, improve work-life balance. . Add non-starchy vegetables to leftovers. Try cooked green beans and carrots, 2 meals a day, 7 days a week.  Handouts Provided Include   Goal sheet  Learning Style & Readiness for Change Teaching method utilized: Visual & Auditory  Demonstrated degree of understanding via: Teach Back  Barriers to learning/adherence to lifestyle change: Work schedule  RD's Notes for next Visit  . Check in on progress of self care. . Has pt. Tried any new vegetables   MONITORING & EVALUATION Dietary intake, weekly physical activity, body weight, and pre-op goals in 1 month.   Next Steps  Patient is to return to [facility] in 1 month for second SWL.

## 2019-04-16 ENCOUNTER — Ambulatory Visit: Payer: 59 | Admitting: Skilled Nursing Facility1

## 2019-04-17 ENCOUNTER — Other Ambulatory Visit: Payer: Self-pay

## 2019-04-17 ENCOUNTER — Encounter: Payer: 59 | Attending: General Surgery | Admitting: Skilled Nursing Facility1

## 2019-04-17 DIAGNOSIS — E669 Obesity, unspecified: Secondary | ICD-10-CM | POA: Diagnosis not present

## 2019-04-17 NOTE — Progress Notes (Signed)
Supervised Weight Loss Visit Bariatric Nutrition Education  Planned Surgery: RYGB    Referral stated Supervised Weight Loss (SWL) visits needed: 3 2 out of 3 SWL Appointments   Pt. Reports liking very few vegetables (dislikes the taste). Reports having GI distress (gas) when consuming raw vegetables. She is willing to make changes, try cooked green beans, carrots.  Pt states with some deadlines passing at work she has been working less and had some days off. Pt states she eats carrots or green beans 5 days a week.   NUTRITION ASSESSMENT  Anthropometrics  Start weight at [facility]: 259.5 lbs (date: 02/23/2019) Today's weight: 257.4 lbs Weight change: +4.3 lbs (since previous visit) BMI: 48.65 kg/m2    Clinical  Medical Hx: Sleep Apnea Medications: See list Labs:  Notable Signs/Symptoms: None reported  Lifestyle & Dietary Hx  Does not eat until 10-11 am Evening meals are usually "heavy" (Meat and potatoes, pasta). Reports eating leftovers the next day. Considers pasta to be a "bad" food. Recognizes how these foods make here feel.  Estimated daily fluid intake:  Supplements: None reported GI / other notable symptoms: Gas from raw vegetables. Current average weekly physical activity: ADLs  24-Hr Dietary Recall First Meal: (10-11 am) 2 eggs w/ ham, and onion Snack: 100 calorie pack of nuts Second Meal: Leftovers from previous dinner Snack: apple or orange or banana Third Meal: Spaghetti and meatballs Snack: still snacking but lessened  Beverages: water with falvorings  Estimated Energy Needs Calories: 1600 Carbohydrate: 180g Protein: 100g Fat: 53g   NUTRITION DIAGNOSIS  Overweight/obesity (Start-3.3) related to past poor dietary habits and physical inactivity as evidenced by patient w/ planned RYGB surgery following dietary guidelines for continued weight loss.   NUTRITION INTERVENTION  Nutrition counseling (C-1) and education (E-2) to facilitate bariatric surgery  goals.   Educated patient on importance of ability to self-care pre and post-surgery.   Counseled patient on looking for ways to increase ability to self-care.  Educated patient on importance of consistent meal intake.  Educated patient on importance of consistent calcium intake.  Pre-Op Goals Progress & New Goals . Take a 30 minute lunch break at work, away from desk. . Talk with husband about how can I delegate more at work to free up responsibilities, improve work-life balance. . Add non-starchy vegetables to leftovers. Try cooked green beans and carrots, 2 meals a day, 7 days a week. Marland Kitchen Keep working on snacking less at night . Try asparagus  . Continue to walk 2 times a week . Aim for sometimes just drops of lemon and sometimes drops of crystal light  Handouts Provided Include    Learning Style & Readiness for Change Teaching method utilized: Visual & Auditory  Demonstrated degree of understanding via: Teach Back  Barriers to learning/adherence to lifestyle change: Work schedule  RD's Notes for next Visit  . Check in on progress of self care. . Has pt. Tried any new vegetables   MONITORING & EVALUATION Dietary intake, weekly physical activity, body weight, and pre-op goals in 1 month.   Next Steps  Patient is to return to [facility] in 1 month for thrid SWL.

## 2019-04-23 ENCOUNTER — Encounter: Payer: 59 | Admitting: Skilled Nursing Facility1

## 2019-04-23 ENCOUNTER — Other Ambulatory Visit: Payer: Self-pay

## 2019-04-23 DIAGNOSIS — E669 Obesity, unspecified: Secondary | ICD-10-CM | POA: Diagnosis not present

## 2019-04-23 NOTE — Progress Notes (Signed)
Pre-Operative Nutrition Class:  Appt start time: 2035   End time:  1830.  Patient was seen on 04/23/2019 for Pre-Operative Bariatric Surgery Education at [facility].   Surgery date:  Surgery type: RYGB Start weight at [facility]: 259.5 Weight today: 262.3   The following the learning objectives were met by the patient during this course:  Identify Pre-Op Dietary Goals and will begin 2 weeks pre-operatively  Identify appropriate sources of fluids and proteins   State protein recommendations and appropriate sources pre and post-operatively  Identify Post-Operative Dietary Goals and will follow for 2 weeks post-operatively  Identify appropriate multivitamin and calcium sources  Describe the need for physical activity post-operatively and will follow MD recommendations  State when to call healthcare provider regarding medication questions or post-operative complications  Handouts given during class include:  Pre-Op Bariatric Surgery Diet Handout  Protein Shake Handout  Post-Op Bariatric Surgery Nutrition Handout  [facility] Program Information Flyer  Support Group Information Flyer  [facility] Bariatric Supplements Price List  Follow-Up Plan: Patient will follow-up at [facility] 2 weeks post operatively for diet advancement per MD.

## 2019-05-01 ENCOUNTER — Encounter: Payer: Self-pay | Admitting: Internal Medicine

## 2019-05-01 DIAGNOSIS — E039 Hypothyroidism, unspecified: Secondary | ICD-10-CM

## 2019-05-01 MED ORDER — LEVOTHYROXINE SODIUM 150 MCG PO TABS: 150.0000 ug | ORAL_TABLET | Freq: Every day | ORAL | 11 refills | Status: DC

## 2019-05-01 NOTE — Telephone Encounter (Signed)
Have sent in new dosing can you call and schedule lab visit in 6-8 weeks?

## 2019-05-02 ENCOUNTER — Other Ambulatory Visit: Payer: Self-pay

## 2019-05-02 ENCOUNTER — Ambulatory Visit (HOSPITAL_COMMUNITY)
Admission: RE | Admit: 2019-05-02 | Discharge: 2019-05-02 | Disposition: A | Discharge status: Home / Self Care | Payer: 59 | Source: Ambulatory Visit | Attending: General Surgery | Admitting: General Surgery

## 2019-05-02 NOTE — Telephone Encounter (Signed)
Left pt vm to call back to schedule 6-8 week lab visit.

## 2019-05-07 ENCOUNTER — Other Ambulatory Visit: Payer: Self-pay

## 2019-05-07 ENCOUNTER — Ambulatory Visit: Payer: 59 | Admitting: Family Medicine

## 2019-05-07 ENCOUNTER — Encounter: Payer: Self-pay | Admitting: Family Medicine

## 2019-05-07 VITALS — BP 132/72 | HR 73 | Temp 97.1°F | Ht 62.0 in | Wt 269.0 lb

## 2019-05-07 DIAGNOSIS — Z9989 Dependence on other enabling machines and devices: Secondary | ICD-10-CM | POA: Diagnosis not present

## 2019-05-07 DIAGNOSIS — G4733 Obstructive sleep apnea (adult) (pediatric): Secondary | ICD-10-CM

## 2019-05-07 NOTE — Patient Instructions (Signed)
Continue CPAP nightly and greater than 4 hours each night   We will send an order for a mask refitting. Please continue to assess for leak at home by green/red light, call with continued concerns if not improved with mask change.  Follow up in 3 months   Sleep Apnea Sleep apnea affects breathing during sleep. It causes breathing to stop for a short time or to become shallow. It can also increase the risk of:  Heart attack.  Stroke.  Being very overweight (obese).  Diabetes.  Heart failure.  Irregular heartbeat. The goal of treatment is to help you breathe normally again. What are the causes? There are three kinds of sleep apnea:  Obstructive sleep apnea. This is caused by a blocked or collapsed airway.  Central sleep apnea. This happens when the brain does not send the right signals to the muscles that control breathing.  Mixed sleep apnea. This is a combination of obstructive and central sleep apnea. The most common cause of this condition is a collapsed or blocked airway. This can happen if:  Your throat muscles are too relaxed.  Your tongue and tonsils are too large.  You are overweight.  Your airway is too small. What increases the risk?  Being overweight.  Smoking.  Having a small airway.  Being older.  Being female.  Drinking alcohol.  Taking medicines to calm yourself (sedatives or tranquilizers).  Having family members with the condition. What are the signs or symptoms?  Trouble staying asleep.  Being sleepy or tired during the day.  Getting angry a lot.  Loud snoring.  Headaches in the morning.  Not being able to focus your mind (concentrate).  Forgetting things.  Less interest in sex.  Mood swings.  Personality changes.  Feelings of sadness (depression).  Waking up a lot during the night to pee (urinate).  Dry mouth.  Sore throat. How is this diagnosed?  Your medical history.  A physical exam.  A test that is done  when you are sleeping (sleep study). The test is most often done in a sleep lab but may also be done at home. How is this treated?   Sleeping on your side.  Using a medicine to get rid of mucus in your nose (decongestant).  Avoiding the use of alcohol, medicines to help you relax, or certain pain medicines (narcotics).  Losing weight, if needed.  Changing your diet.  Not smoking.  Using a machine to open your airway while you sleep, such as: ? An oral appliance. This is a mouthpiece that shifts your lower jaw forward. ? A CPAP device. This device blows air through a mask when you breathe out (exhale). ? An EPAP device. This has valves that you put in each nostril. ? A BPAP device. This device blows air through a mask when you breathe in (inhale) and breathe out.  Having surgery if other treatments do not work. It is important to get treatment for sleep apnea. Without treatment, it can lead to:  High blood pressure.  Coronary artery disease.  In men, not being able to have an erection (impotence).  Reduced thinking ability. Follow these instructions at home: Lifestyle  Make changes that your doctor recommends.  Eat a healthy diet.  Lose weight if needed.  Avoid alcohol, medicines to help you relax, and some pain medicines.  Do not use any products that contain nicotine or tobacco, such as cigarettes, e-cigarettes, and chewing tobacco. If you need help quitting, ask your doctor.  General instructions  Take over-the-counter and prescription medicines only as told by your doctor.  If you were given a machine to use while you sleep, use it only as told by your doctor.  If you are having surgery, make sure to tell your doctor you have sleep apnea. You may need to bring your device with you.  Keep all follow-up visits as told by your doctor. This is important. Contact a doctor if:  The machine that you were given to use during sleep bothers you or does not seem to be  working.  You do not get better.  You get worse. Get help right away if:  Your chest hurts.  You have trouble breathing in enough air.  You have an uncomfortable feeling in your back, arms, or stomach.  You have trouble talking.  One side of your body feels weak.  A part of your face is hanging down. These symptoms may be an emergency. Do not wait to see if the symptoms will go away. Get medical help right away. Call your local emergency services (911 in the [place] Do not drive yourself to the hospital. Summary  This condition affects breathing during sleep.  The most common cause is a collapsed or blocked airway.  The goal of treatment is to help you breathe normally while you sleep. This information is not intended to replace advice given to you by your health care provider. Make sure you discuss any questions you have with your health care provider. Document Revised: 01/27/2018 Document Reviewed: 12/06/2017 Elsevier Patient Education  [place].   CPAP and BPAP Information CPAP and BPAP are methods of helping a person breathe with the use of air pressure. CPAP stands for "continuous positive airway pressure." BPAP stands for "bi-level positive airway pressure." In both methods, air is blown through your nose or mouth and into your air passages to help you breathe well. CPAP and BPAP use different amounts of pressure to blow air. With CPAP, the amount of pressure stays the same while you breathe in and out. With BPAP, the amount of pressure is increased when you breathe in (inhale) so that you can take larger breaths. Your health care provider will recommend whether CPAP or BPAP would be more helpful for you. Why are CPAP and BPAP treatments used? CPAP or BPAP can be helpful if you have:  Sleep apnea.  Chronic obstructive pulmonary disease (COPD).  Heart failure.  Medical conditions that weaken the muscles of the chest including muscular dystrophy, or  neurological diseases such as amyotrophic lateral sclerosis (ALS).  Other problems that cause breathing to be weak, abnormal, or difficult. CPAP is most commonly used for obstructive sleep apnea (OSA) to keep the airways from collapsing when the muscles relax during sleep. How is CPAP or BPAP administered? Both CPAP and BPAP are provided by a small machine with a flexible plastic tube that attaches to a plastic mask. You wear the mask. Air is blown through the mask into your nose or mouth. The amount of pressure that is used to blow the air can be adjusted on the machine. Your health care provider will determine the pressure setting that should be used based on your individual needs. When should CPAP or BPAP be used? In most cases, the mask only needs to be worn during sleep. Generally, the mask needs to be worn throughout the night and during any daytime naps. People with certain medical conditions may also need to wear the mask at other  times when they are awake. Follow instructions from your health care provider about when to use the machine. What are some tips for using the mask?   Because the mask needs to be snug, some people feel trapped or closed-in (claustrophobic) when first using the mask. If you feel this way, you may need to get used to the mask. One way to do this is by holding the mask loosely over your nose or mouth and then gradually applying the mask more snugly. You can also gradually increase the amount of time that you use the mask.  Masks are available in various types and sizes. Some fit over your mouth and nose while others fit over just your nose. If your mask does not fit well, talk with your health care provider about getting a different one.  If you are using a mask that fits over your nose and you tend to breathe through your mouth, a chin strap may be applied to help keep your mouth closed.  The CPAP and BPAP machines have alarms that may sound if the mask comes off or  develops a leak.  If you have trouble with the mask, it is very important that you talk with your health care provider about finding a way to make the mask easier to tolerate. Do not stop using the mask. Stopping the use of the mask could have a negative impact on your health. What are some tips for using the machine?  Place your CPAP or BPAP machine on a secure table or stand near an electrical outlet.  Know where the on/off switch is located on the machine.  Follow instructions from your health care provider about how to set the pressure on your machine and when you should use it.  Do not eat or drink while the CPAP or BPAP machine is on. Food or fluids could get pushed into your lungs by the pressure of the CPAP or BPAP.  Do not smoke. Tobacco smoke residue can damage the machine.  For home use, CPAP and BPAP machines can be rented or purchased through home health care companies. Many different brands of machines are available. Renting a machine before purchasing may help you find out which particular machine works well for you.  Keep the CPAP or BPAP machine and attachments clean. Ask your health care provider for specific instructions. Get help right away if:  You have redness or open areas around your nose or mouth where the mask fits.  You have trouble using the CPAP or BPAP machine.  You cannot tolerate wearing the CPAP or BPAP mask.  You have pain, discomfort, and bloating in your abdomen. Summary  CPAP and BPAP are methods of helping a person breathe with the use of air pressure.  Both CPAP and BPAP are provided by a small machine with a flexible plastic tube that attaches to a plastic mask.  If you have trouble with the mask, it is very important that you talk with your health care provider about finding a way to make the mask easier to tolerate. This information is not intended to replace advice given to you by your health care provider. Make sure you discuss any  questions you have with your health care provider. Document Revised: 08/02/2018 Document Reviewed: 03/01/2016 Elsevier Patient Education  [place].

## 2019-05-07 NOTE — Progress Notes (Addendum)
PATIENT: [name] DOB: 1966-03-20  REASON FOR VISIT: follow up HISTORY FROM: patient  Chief Complaint  Patient presents with  . Follow-up    Initial cpap f/u. Alone. Rm 5. No new concerns at this time.      HISTORY OF PRESENT ILLNESS: Today 05/07/19 Deniesha York is a 54 y.o. female here today for follow up of severe OSA on CPAP. HST revealed AHI of 43.4 and O2 nadir of 81%.  She is doing well with CPAP compliance.  She reports that she is continuing to adjust to therapy.  She does note a leak in her mask when she is lying on her side.  She is interested in trying a different type of mask.  She does note significant improvement in headache intensity and frequency.  Compliance report dated 04/03/2019 through 05/02/2019 reveals that she use CPAP 29 of the last 30 days for compliance of 97%.  25 of the last 30 days she used CPAP greater than 4 hours for compliance of 83%.  Average usage was 7 hours and 7 minutes.  Residual AHI was 4.0 on 7 to 13 cm of water and an EPR of 3.  There was a significant leak noted in the 95th percentile of 37.0 L/min.  HISTORY: (copied from Dr Guadelupe Sabin note on 01/30/2019)  Dear Dr. Sharlet Salina,  I saw your patient, [name], upon your kind request in my sleep clinic today for initial consultation of her sleep disorder, in particular, concern for underlying obstructive sleep apnea.  Patient is unaccompanied today.  As you know, [name] is a 54 year old right-handed woman with an underlying medical history of supraventricular tachycardia, history of thyroid cancer, status post total thyroidectomy and radiation, depression, anxiety and morbid obesity with a BMI of over 45, who reports snoring and excessive daytime somnolence.  She has had recurrent morning headaches.  She has had mental fogginess.  She reports feeling sluggish in the morning and having a hard time getting started in the mornings.  She has tried Ambien in the past but stopped it  about a year ago because she felt groggy in the daytime and did not like the feeling of relying on a sleep aid nightly. I reviewed your office note from 12/28/2018.  Her Epworth sleepiness score is 13 out of 24, fatigue severity score is 59 out of 63.  She wakes up with her uvula swollen, she has mouth breathing.  She has no obvious family history of OSA.  She has nocturia about twice per average night.  Her morning headaches are dull and achy, not severe and she does not typically take any medication for this.  She lives with her husband, they do have 2 dogs in the household who stay in a kennel in their bedroom at night, not on the bed.  There is a TV in the bedroom but she rarely watches it and if they turn it on at night they turn it off before falling asleep.  She has had steady weight gain over the past few years.  She has thought about getting evaluated for bariatric surgery.  She has not felt rested for at least 2 years and has been snoring for longer.  She has 2 grown children, 1 daughter, 1 son, their son is in the [employer] and currently staying with them.  She is a non-smoker and drinks green tea, about 2 bottles per day, occasional alcohol every other weekend or so.  Bedtime and rise time vary.  She works as an Optometrist.    REVIEW OF SYSTEMS: Out of a complete 14 system review of symptoms, the patient complains only of the following symptoms, none and all other reviewed systems are negative.  Epworth sleepiness scale: 7 Fatigue severity scale: 29  ALLERGIES: Allergies  Allergen Reactions  . Codeine Itching    HOME MEDICATIONS: Outpatient Medications Prior to Visit  Medication Sig Dispense Refill  . amphetamine-dextroamphetamine (ADDERALL) 10 MG tablet Take 10 mg by mouth 2 (two) times daily with a meal.     . buPROPion (WELLBUTRIN XL) 300 MG 24 hr tablet Take 300 mg by mouth every morning.     . fluticasone (FLONASE) 50 MCG/ACT nasal spray Place 2 sprays into both nostrils daily. 16 g  6  . levothyroxine (SYNTHROID) 150 MCG tablet Take 1 tablet (150 mcg total) by mouth daily. 30 tablet 11  . valACYclovir (VALTREX) 1000 MG tablet Take 1 tablet (1,000 mg total) by mouth every 12 (twelve) hours as needed (for onset of cold sores). 180 tablet 3  . venlafaxine XR (EFFEXOR-XR) 75 MG 24 hr capsule Take 75 mg by mouth daily with breakfast.   6   No facility-administered medications prior to visit.    PAST MEDICAL HISTORY: Past Medical History:  Diagnosis Date  . Anxiety   . Depression   . H/O total thyroidectomy 1995   for thyoid cancer  . History of adenomatous polyp of colon    tubular adenoma's 01-14-2011;  02-04-2016  . History of eating disorder    depression  . History of ovarian cyst    12-21-2013  at [facility]  lap. w/ lso and right salpingecotmy-- per path left ovary cystic teratoma and bilateral fallopian paratubal cyst's  . History of radiation to head and neck region 1995   thryoid cancer--- pt states no throat/ swallowing issues  . History of supraventricular tachycardia since age 78- had not seen a cardiologist until adulthood   sustained SVT since age 72-- has had ED visit's w/ adenosine and cardioversion in past/  05-10-2013  RFCA of AVNRT ablation by dr gregg taylor/  12-20-2016 per pt no not any issues, S&S since ablation  . History of thyroid cancer dx 1995--- 12-20-2016 per pt no recurrence and was release from oncologist   s/p  total thyroidectomy and radiation completed 1996  . Hypothyroidism, postsurgical 1995  . Major depressive disorder     PAST SURGICAL HISTORY: Past Surgical History:  Procedure Laterality Date  . APPENDECTOMY  2004  . CESAREAN SECTION  1986 and 1996   Bilateral Tubal Ligation w/ last c/s 1996  . DILATATION & CURETTAGE/HYSTEROSCOPY WITH MYOSURE N/A 12/24/2016   Procedure: DILATATION & CURETTAGE/HYSTEROSCOPY WITH MYOSURE;  Surgeon: [name], MD;  Location: [place];  Service: Gynecology;  Laterality:  N/A;  requesting 7:30am OR time  requests one hour  . HYSTEROSCOPY WITH NOVASURE N/A 12/24/2016   Procedure: HYSTEROSCOPY WITH NOVASURE;  Surgeon: [name], MD;  Location: [place];  Service: Gynecology;  Laterality: N/A;  . KNEE ARTHROSCOPY W/ ACL RECONSTRUCTION Bilateral 04/ 2015;  2012  . LAPAROSCOPIC SALPINGOOPHERECTOMY  12-21-2013  dr Fermin Schwab at [facility] and right salpingectomy  . LIPOMA EXCISION  1990's ;  10-13-2004 dr Excell Seltzer at [facility]   right neck  . SUPRAVENTRICULAR TACHYCARDIA ABLATION N/A 05/10/2013   Procedure: SUPRAVENTRICULAR TACHYCARDIA ABLATION;  Surgeon: Evans Lance, MD;  Location: [facility] CATH LAB;  Service: Cardiovascular;  Laterality: N/A;  . TOTAL THYROIDECTOMY  Bilateral 1995    FAMILY HISTORY: Family History  Problem Relation Age of Onset  . Alzheimer's disease Mother        currently 65.  . Colon cancer Father 79       currently 53.  . Lung cancer Father   . Alzheimer's disease Maternal Grandmother   . Colon cancer Paternal Grandmother   . Colon cancer Paternal Grandfather   . Stomach cancer Neg Hx     SOCIAL HISTORY: Social History   Socioeconomic History  . Marital status: Married    Spouse name: Not on file  . Number of children: 2  . Years of education: Not on file  . Highest education level: Not on file  Occupational History  . Not on file  Tobacco Use  . Smoking status: Never Smoker  . Smokeless tobacco: Never Used  . Tobacco comment: Married, lives with spouse. Works as Nurse, learning disability and Sexual Activity  . Alcohol use: Yes    Comment: seldom  . Drug use: No  . Sexual activity: Yes  Other Topics Concern  . Not on file  Social History Narrative   Lives in [place] with husband and son.  Chemical engineer.   Social Determinants of Health   Financial Resource Strain:   . Difficulty of Paying Living Expenses: Not on file  Food Insecurity:   . Worried About Charity fundraiser in the Last  Year: Not on file  . Ran Out of Food in the Last Year: Not on file  Transportation Needs:   . Lack of Transportation (Medical): Not on file  . Lack of Transportation (Non-Medical): Not on file  Physical Activity:   . Days of Exercise per Week: Not on file  . Minutes of Exercise per Session: Not on file  Stress:   . Feeling of Stress : Not on file  Social Connections:   . Frequency of Communication with Friends and Family: Not on file  . Frequency of Social Gatherings with Friends and Family: Not on file  . Attends Religious Services: Not on file  . Active Member of Clubs or Organizations: Not on file  . Attends Archivist Meetings: Not on file  . Marital Status: Not on file  Intimate Partner Violence:   . Fear of Current or Ex-Partner: Not on file  . Emotionally Abused: Not on file  . Physically Abused: Not on file  . Sexually Abused: Not on file      PHYSICAL EXAM  Vitals:   05/07/19 1417  BP: 132/72  Pulse: 73  Temp: (!) 97.1 F (36.2 C)  TempSrc: Oral  Weight: 269 lb (122 kg)  Height: 5\' 2"  (1.575 m)   Body mass index is 49.2 kg/m.  Generalized: Well developed, in no acute distress  Cardiology: normal rate and rhythm, no murmur noted Respiratory: Clear to auscultation bilaterally Neurological examination  Mentation: Alert oriented to time, place, history taking. Follows all commands speech and language fluent Cranial nerve II-XII: Pupils were equal round reactive to light. Extraocular movements were full, visual field were full Motor: The motor testing reveals 5 over 5 strength of all 4 extremities. Good symmetric motor tone is noted throughout.   Gait and station: Gait is normal.   DIAGNOSTIC DATA (LABS, IMAGING, TESTING) - I reviewed patient records, labs, notes, testing and imaging myself where available.  No flowsheet data found.   Lab Results  Component Value Date   WBC 6.6 02/19/2019   HGB 14.6 02/19/2019  HCT 45 02/19/2019   MCV 91.2  09/22/2017   PLT 238 02/19/2019      Component Value Date/Time   NA 140 02/19/2019 0000   K 4.5 02/19/2019 0000   CL 103 09/22/2017 1058   CO2 26 09/22/2017 1058   GLUCOSE 94 09/22/2017 1058   BUN 12 02/19/2019 0000   CREATININE 0.9 02/19/2019 0000   CREATININE 0.79 09/22/2017 1058   CALCIUM 9.0 09/22/2017 1058   PROT 7.4 09/22/2017 1058   ALBUMIN 4.2 02/19/2019 0000   AST 17 02/19/2019 0000   ALT 19 02/19/2019 0000   ALKPHOS 80 02/19/2019 0000   BILITOT 0.5 09/22/2017 1058   GFRNONAA 85 (L) 05/11/2013 0200   GFRAA >90 05/11/2013 0200   Lab Results  Component Value Date   CHOL 171 02/19/2019   HDL 42 02/19/2019   LDLCALC 101 02/19/2019   TRIG 189 (A) 02/19/2019   CHOLHDL 3 09/22/2017   Lab Results  Component Value Date   HGBA1C 5.1 02/19/2019   No results found for: [id number] Lab Results  Component Value Date   TSH 0.08 (A) 02/19/2019       ASSESSMENT AND PLAN 54 y.o. year old female  has a past medical history of Anxiety, Depression, H/O total thyroidectomy (1995), History of adenomatous polyp of colon, History of eating disorder, History of ovarian cyst, History of radiation to head and neck region (1995), History of supraventricular tachycardia (since age 59- had not seen a cardiologist until adulthood), History of thyroid cancer (dx 1995--- 12-20-2016 per pt no recurrence and was release from oncologist), Hypothyroidism, postsurgical (1995), and Major depressive disorder. here with     ICD-10-CM   1. OSA on CPAP  G47.33 For home use only DME continuous positive airway pressure (CPAP)   Z99.89     [name] is doing very well overall with her new CPAP machine.  She continues to adjust to nightly use.  Compliance report reveals optimal compliance.  She was encouraged to continue using CPAP nightly for greater than 4 hours each night.  We will send an order to her DME company for a new mask refitting as she has notes continued leak with the current mask.  She is aware  of how to monitor for appropriate fit on her CPAP app.  She will follow-up with me in 3 months to review compliance and leak.  She verbalizes understanding and agreement with this plan.    Orders Placed This Encounter  Procedures  . For home use only DME continuous positive airway pressure (CPAP)    Mask refitting please    Order Specific Question:   Length of Need    Answer:   Lifetime    Order Specific Question:   Patient has OSA or probable OSA    Answer:   Yes    Order Specific Question:   Is the patient currently using CPAP in the home    Answer:   Yes    Order Specific Question:   Settings    Answer:   Other see comments    Order Specific Question:   CPAP supplies needed    Answer:   Mask, headgear, cushions, filters, heated tubing and water chamber     No orders of the defined types were placed in this encounter.     I spent 15 minutes with the patient. 50% of this time was spent counseling and educating patient on plan of care and medications.    Debbora Presto, FNP-C 05/07/2019, 2:47 PM  [facility] [address] [place], [place] [address] [phone]  I reviewed the above note and documentation by the Nurse Practitioner and agree with the history, exam, assessment and plan as outlined above. I was available for consultation. Star Age, MD, PhD [facility])

## 2019-05-08 NOTE — Addendum Note (Signed)
Addended byDebbora Presto L on: 05/08/2019 02:42 PM   Modules accepted: Orders

## 2019-05-14 ENCOUNTER — Ambulatory Visit: Payer: 59 | Admitting: Skilled Nursing Facility1

## 2019-05-16 ENCOUNTER — Ambulatory Visit: Payer: 59 | Admitting: Psychology

## 2019-05-30 ENCOUNTER — Ambulatory Visit: Payer: 59 | Admitting: Psychology

## 2019-06-20 ENCOUNTER — Ambulatory Visit (INDEPENDENT_AMBULATORY_CARE_PROVIDER_SITE_OTHER): Payer: 59 | Admitting: Psychology

## 2019-06-20 DIAGNOSIS — F509 Eating disorder, unspecified: Secondary | ICD-10-CM | POA: Diagnosis not present

## 2019-06-26 ENCOUNTER — Ambulatory Visit: Payer: 59 | Admitting: Psychology

## 2019-06-26 ENCOUNTER — Ambulatory Visit (INDEPENDENT_AMBULATORY_CARE_PROVIDER_SITE_OTHER): Payer: 59 | Admitting: Psychology

## 2019-07-17 ENCOUNTER — Ambulatory Visit: Payer: 59 | Admitting: Psychology

## 2019-08-06 ENCOUNTER — Ambulatory Visit: Payer: 59 | Admitting: Family Medicine

## 2019-08-06 ENCOUNTER — Other Ambulatory Visit: Payer: Self-pay

## 2019-08-06 ENCOUNTER — Encounter: Payer: Self-pay | Admitting: Family Medicine

## 2019-08-06 VITALS — BP 133/84 | HR 88 | Temp 97.2°F | Ht 62.0 in | Wt 264.0 lb

## 2019-08-06 DIAGNOSIS — G4733 Obstructive sleep apnea (adult) (pediatric): Secondary | ICD-10-CM

## 2019-08-06 DIAGNOSIS — Z9989 Dependence on other enabling machines and devices: Secondary | ICD-10-CM | POA: Diagnosis not present

## 2019-08-06 NOTE — Patient Instructions (Addendum)
°Please continue using your CPAP regularly. While your insurance requires that you use CPAP at least 4 hours each night on 70% of the nights, I recommend, that you not skip any nights and use it throughout the night if you can. Getting used to CPAP and staying with the treatment long term does take time and patience and discipline. Untreated obstructive sleep apnea when it is moderate to severe can have an adverse impact on cardiovascular health and raise her risk for heart disease, arrhythmias, hypertension, congestive heart failure, stroke and diabetes. Untreated obstructive sleep apnea causes sleep disruption, nonrestorative sleep, and sleep deprivation. This can have an impact on your day to day functioning and cause daytime sleepiness and impairment of cognitive function, memory loss, mood disturbance, and problems focussing. Using CPAP regularly can improve these symptoms. ° ° °Follow up in 1 year  ° °CPAP and BPAP Information °CPAP and BPAP are methods of helping a person breathe with the use of air pressure. CPAP stands for "continuous positive airway pressure." BPAP stands for "bi-level positive airway pressure." In both methods, air is blown through your nose or mouth and into your air passages to help you breathe well. °CPAP and BPAP use different amounts of pressure to blow air. With CPAP, the amount of pressure stays the same while you breathe in and out. With BPAP, the amount of pressure is increased when you breathe in (inhale) so that you can take larger breaths. Your health care provider will recommend whether CPAP or BPAP would be more helpful for you. °Why are CPAP and BPAP treatments used? °CPAP or BPAP can be helpful if you have: °· Sleep apnea. °· Chronic obstructive pulmonary disease (COPD). °· Heart failure. °· Medical conditions that weaken the muscles of the chest including muscular dystrophy, or neurological diseases such as amyotrophic lateral sclerosis (ALS). °· Other problems that  cause breathing to be weak, abnormal, or difficult. °CPAP is most commonly used for obstructive sleep apnea (OSA) to keep the airways from collapsing when the muscles relax during sleep. °How is CPAP or BPAP administered? °Both CPAP and BPAP are provided by a small machine with a flexible plastic tube that attaches to a plastic mask. You wear the mask. Air is blown through the mask into your nose or mouth. The amount of pressure that is used to blow the air can be adjusted on the machine. Your health care provider will determine the pressure setting that should be used based on your individual needs. °When should CPAP or BPAP be used? °In most cases, the mask only needs to be worn during sleep. Generally, the mask needs to be worn throughout the night and during any daytime naps. People with certain medical conditions may also need to wear the mask at other times when they are awake. Follow instructions from your health care provider about when to use the machine. °What are some tips for using the mask? ° °· Because the mask needs to be snug, some people feel trapped or closed-in (claustrophobic) when first using the mask. If you feel this way, you may need to get used to the mask. One way to do this is by holding the mask loosely over your nose or mouth and then gradually applying the mask more snugly. You can also gradually increase the amount of time that you use the mask. °· Masks are available in various types and sizes. Some fit over your mouth and nose while others fit over just your nose. If your mask   does not fit well, talk with your health care provider about getting a different one. °· If you are using a mask that fits over your nose and you tend to breathe through your mouth, a chin strap may be applied to help keep your mouth closed. °· The CPAP and BPAP machines have alarms that may sound if the mask comes off or develops a leak. °· If you have trouble with the mask, it is very important that you talk  with your health care provider about finding a way to make the mask easier to tolerate. Do not stop using the mask. Stopping the use of the mask could have a negative impact on your health. °What are some tips for using the machine? °· Place your CPAP or BPAP machine on a secure table or stand near an electrical outlet. °· Know where the on/off switch is located on the machine. °· Follow instructions from your health care provider about how to set the pressure on your machine and when you should use it. °· Do not eat or drink while the CPAP or BPAP machine is on. Food or fluids could get pushed into your lungs by the pressure of the CPAP or BPAP. °· Do not smoke. Tobacco smoke residue can damage the machine. °· For home use, CPAP and BPAP machines can be rented or purchased through home health care companies. Many different brands of machines are available. Renting a machine before purchasing may help you find out which particular machine works well for you. °· Keep the CPAP or BPAP machine and attachments clean. Ask your health care provider for specific instructions. °Get help right away if: °· You have redness or open areas around your nose or mouth where the mask fits. °· You have trouble using the CPAP or BPAP machine. °· You cannot tolerate wearing the CPAP or BPAP mask. °· You have pain, discomfort, and bloating in your abdomen. °Summary °· CPAP and BPAP are methods of helping a person breathe with the use of air pressure. °· Both CPAP and BPAP are provided by a small machine with a flexible plastic tube that attaches to a plastic mask. °· If you have trouble with the mask, it is very important that you talk with your health care provider about finding a way to make the mask easier to tolerate. °This information is not intended to replace advice given to you by your health care provider. Make sure you discuss any questions you have with your health care provider. °Document Revised: 08/02/2018 Document  Reviewed: 03/01/2016 °Elsevier Patient Education © 2020 [employer] Inc[employer] ° °

## 2019-08-06 NOTE — Progress Notes (Addendum)
PATIENT: [name] DOB: 1965-11-20  REASON FOR VISIT: follow up HISTORY FROM: patient  Chief Complaint  Patient presents with  . Follow-up    rm 2, alone, cpap, pt is doing well     HISTORY OF PRESENT ILLNESS: Today 08/06/19 [name] is a 54 y.o. female here today for follow up for OSA on CPAP. She was sent for mask refitting in 04/2019 due to mask leak. She reports that leak has resolved with new FFM but she does not feel it is as comfortable. She is a mouth breather and feels that her mouth is more dry. Overall, she does feel better when using CPAP therapy.   Compliance report dated 07/07/2019 through 08/05/2019 reveals that she is used CPAP 27 of the past 30 days for compliance of 90%.  She has used CPAP greater than 4 hours 24 of the past 30 days for compliance of 80%.  Average usage was 6 hours and 46 minutes.  Residual AHI was 3.7 on 7 to 13 cm of water and an EPR of 3.  Leak has significantly reduced to 6.3 L/min.  HISTORY: (copied from my note on 05/07/2019)  [name] is a 54 y.o. female here today for follow up of severe OSA on CPAP. HST revealed AHI of 43.4 and O2 nadir of 81%.  She is doing well with CPAP compliance.  She reports that she is continuing to adjust to therapy.  She does note a leak in her mask when she is lying on her side.  She is interested in trying a different type of mask.  She does note significant improvement in headache intensity and frequency.  Compliance report dated 04/03/2019 through 05/02/2019 reveals that she use CPAP 29 of the last 30 days for compliance of 97%.  25 of the last 30 days she used CPAP greater than 4 hours for compliance of 83%.  Average usage was 7 hours and 7 minutes.  Residual AHI was 4.0 on 7 to 13 cm of water and an EPR of 3.  There was a significant leak noted in the 95th percentile of 37.0 L/min.  HISTORY: (copied from Dr Guadelupe Sabin note on 01/30/2019)  Dear Dr. Sharlet Salina,  I saw your patient,  [name], upon your kind request in my sleep clinic today for initial consultation of her sleep disorder, in particular, concern for underlying obstructive sleep apnea. Patient is unaccompanied today. As you know, Ms. Rapier is a 54 year old right-handed woman with an underlying medical history of supraventricular tachycardia, history of thyroid cancer, status post total thyroidectomy and radiation, depression, anxiety and morbid obesity with a BMI ofover 45, whoreports snoring and excessive daytime somnolence. She has had recurrent morning headaches. She has had mental fogginess. She reports feeling sluggish in the morning and having a hard time getting started in the mornings. She has tried Ambien in the past but stopped it about a year ago because she felt groggy in the daytime and did not like the feeling of relying on a sleep aid nightly. I reviewed your office note from 12/28/2018. Her Epworth sleepiness score is 13 out of 24, fatigue severity score is 59 out of 63. She wakes up with her uvula swollen, she has mouth breathing. She has no obvious family history of OSA. She has nocturia about twice per average night. Her morning headaches are dull and achy, not severe and she does not typically take any medication for this. She lives with her husband, they do  have 2 dogs in the household who stay in a kennel in their bedroom at night, not on the bed. There is a TV in the bedroom but she rarely watches it and if they turn it on at night they turn it off before falling asleep. She has had steady weight gain over the past few years. She has thought about getting evaluated for bariatric surgery. She has not felt rested for at least 2 years and has been snoring for longer. She has 2 grown children, 1 daughter, 1 son, their son is in the [place] and currently staying with them. She is a non-smoker and drinks green tea, about 2 bottles per day, occasional alcohol every other weekend or so.  Bedtime and rise time vary. She works as an Optometrist.   REVIEW OF SYSTEMS: Out of a complete 14 system review of symptoms, the patient complains only of the following symptoms, dry mouth, headaches and all other reviewed systems are negative.  ESS: 6 FSS: 32  ALLERGIES: Allergies  Allergen Reactions  . Codeine Itching    HOME MEDICATIONS: Outpatient Medications Prior to Visit  Medication Sig Dispense Refill  . amphetamine-dextroamphetamine (ADDERALL) 10 MG tablet Take 10 mg by mouth 2 (two) times daily with a meal.     . buPROPion (WELLBUTRIN XL) 300 MG 24 hr tablet Take 300 mg by mouth every morning.     . fluticasone (FLONASE) 50 MCG/ACT nasal spray Place 2 sprays into both nostrils daily. 16 g 6  . levothyroxine (SYNTHROID) 150 MCG tablet Take 1 tablet (150 mcg total) by mouth daily. 30 tablet 11  . valACYclovir (VALTREX) 1000 MG tablet Take 1 tablet (1,000 mg total) by mouth every 12 (twelve) hours as needed (for onset of cold sores). 180 tablet 3  . venlafaxine XR (EFFEXOR-XR) 75 MG 24 hr capsule Take 75 mg by mouth daily with breakfast.   6   No facility-administered medications prior to visit.    PAST MEDICAL HISTORY: Past Medical History:  Diagnosis Date  . Anxiety   . Depression   . H/O total thyroidectomy 1995   for thyoid cancer  . History of adenomatous polyp of colon    tubular adenoma's 01-14-2011;  02-04-2016  . History of eating disorder    depression  . History of ovarian cyst    12-21-2013  at [facility]  lap. w/ lso and right salpingecotmy-- per path left ovary cystic teratoma and bilateral fallopian paratubal cyst's  . History of radiation to head and neck region 1995   thryoid cancer--- pt states no throat/ swallowing issues  . History of supraventricular tachycardia since age 86- had not seen a cardiologist until adulthood   sustained SVT since age 54-- has had ED visit's w/ adenosine and cardioversion in past/  05-10-2013  RFCA of AVNRT ablation by  dr gregg taylor/  12-20-2016 per pt no not any issues, S&S since ablation  . History of thyroid cancer dx 1995--- 12-20-2016 per pt no recurrence and was release from oncologist   s/p  total thyroidectomy and radiation completed 1996  . Hypothyroidism, postsurgical 1995  . Major depressive disorder     PAST SURGICAL HISTORY: Past Surgical History:  Procedure Laterality Date  . APPENDECTOMY  2004  . CESAREAN SECTION  1986 and 1996   Bilateral Tubal Ligation w/ last c/s 1996  . DILATATION & CURETTAGE/HYSTEROSCOPY WITH MYOSURE N/A 12/24/2016   Procedure: DILATATION & CURETTAGE/HYSTEROSCOPY WITH MYOSURE;  Surgeon: [name], MD;  Location: [facility];  Service: Gynecology;  Laterality: N/A;  requesting 7:30am OR time  requests one hour  . HYSTEROSCOPY WITH NOVASURE N/A 12/24/2016   Procedure: HYSTEROSCOPY WITH NOVASURE;  Surgeon: [name], MD;  Location: [facility];  Service: Gynecology;  Laterality: N/A;  . KNEE ARTHROSCOPY W/ ACL RECONSTRUCTION Bilateral 04/ 2015;  2012  . LAPAROSCOPIC SALPINGOOPHERECTOMY  12-21-2013  dr Fermin Schwab at [facility] and right salpingectomy  . LIPOMA EXCISION  1990's ;  10-13-2004 dr Excell Seltzer at [facility]   right neck  . SUPRAVENTRICULAR TACHYCARDIA ABLATION N/A 05/10/2013   Procedure: SUPRAVENTRICULAR TACHYCARDIA ABLATION;  Surgeon: Evans Lance, MD;  Location: [facility] CATH LAB;  Service: Cardiovascular;  Laterality: N/A;  . TOTAL THYROIDECTOMY Bilateral 1995    FAMILY HISTORY: Family History  Problem Relation Age of Onset  . Alzheimer's disease Mother        currently 16.  . Colon cancer Father 8       currently 65.  . Lung cancer Father   . Alzheimer's disease Maternal Grandmother   . Colon cancer Paternal Grandmother   . Colon cancer Paternal Grandfather   . Stomach cancer Neg Hx     SOCIAL HISTORY: Social History   Socioeconomic History  . Marital status: Married    Spouse name: Not on file   . Number of children: 2  . Years of education: Not on file  . Highest education level: Not on file  Occupational History  . Not on file  Tobacco Use  . Smoking status: Never Smoker  . Smokeless tobacco: Never Used  . Tobacco comment: Married, lives with spouse. Works as Nurse, learning disability and Sexual Activity  . Alcohol use: Yes    Comment: seldom  . Drug use: No  . Sexual activity: Yes  Other Topics Concern  . Not on file  Social History Narrative   Lives in [place] with husband and son.  Chemical engineer.   Social Determinants of Health   Financial Resource Strain:   . Difficulty of Paying Living Expenses:   Food Insecurity:   . Worried About Charity fundraiser in the Last Year:   . Arboriculturist in the Last Year:   Transportation Needs:   . Film/video editor (Medical):   Marland Kitchen Lack of Transportation (Non-Medical):   Physical Activity:   . Days of Exercise per Week:   . Minutes of Exercise per Session:   Stress:   . Feeling of Stress :   Social Connections:   . Frequency of Communication with Friends and Family:   . Frequency of Social Gatherings with Friends and Family:   . Attends Religious Services:   . Active Member of Clubs or Organizations:   . Attends Archivist Meetings:   Marland Kitchen Marital Status:   Intimate Partner Violence:   . Fear of Current or Ex-Partner:   . Emotionally Abused:   Marland Kitchen Physically Abused:   . Sexually Abused:       PHYSICAL EXAM  Vitals:   08/06/19 1336  BP: 133/84  Pulse: 88  Temp: (!) 97.2 F (36.2 C)  Weight: 264 lb (119.7 kg)  Height: 5\' 2"  (1.575 m)   Body mass index is 48.29 kg/m.  Generalized: Well developed, in no acute distress  Cardiology: normal rate and rhythm, no murmur noted Respiratory: clear to auscultation bilaterally  Neurological examination  Mentation: Alert oriented to time, place, history taking. Follows all commands speech and language fluent Cranial nerve II-XII: Pupils  were equal  round reactive to light. Extraocular movements were full, visual field were full  Motor: The motor testing reveals 5 over 5 strength of all 4 extremities. Good symmetric motor tone is noted throughout.  Gait and station: Gait is normal.   DIAGNOSTIC DATA (LABS, IMAGING, TESTING) - I reviewed patient records, labs, notes, testing and imaging myself where available.  No flowsheet data found.   Lab Results  Component Value Date   WBC 6.6 02/19/2019   HGB 14.6 02/19/2019   HCT 45 02/19/2019   MCV 91.2 09/22/2017   PLT 238 02/19/2019      Component Value Date/Time   NA 140 02/19/2019 0000   K 4.5 02/19/2019 0000   CL 103 09/22/2017 1058   CO2 26 09/22/2017 1058   GLUCOSE 94 09/22/2017 1058   BUN 12 02/19/2019 0000   CREATININE 0.9 02/19/2019 0000   CREATININE 0.79 09/22/2017 1058   CALCIUM 9.0 09/22/2017 1058   PROT 7.4 09/22/2017 1058   ALBUMIN 4.2 02/19/2019 0000   AST 17 02/19/2019 0000   ALT 19 02/19/2019 0000   ALKPHOS 80 02/19/2019 0000   BILITOT 0.5 09/22/2017 1058   GFRNONAA 85 (L) 05/11/2013 0200   GFRAA >90 05/11/2013 0200   Lab Results  Component Value Date   CHOL 171 02/19/2019   HDL 42 02/19/2019   LDLCALC 101 02/19/2019   TRIG 189 (A) 02/19/2019   CHOLHDL 3 09/22/2017   Lab Results  Component Value Date   HGBA1C 5.1 02/19/2019   No results found for: [id number] Lab Results  Component Value Date   TSH 0.08 (A) 02/19/2019     ASSESSMENT AND PLAN 54 y.o. year old female  has a past medical history of Anxiety, Depression, H/O total thyroidectomy (1995), History of adenomatous polyp of colon, History of eating disorder, History of ovarian cyst, History of radiation to head and neck region (1995), History of supraventricular tachycardia (since age 40- had not seen a cardiologist until adulthood), History of thyroid cancer (dx 1995--- 12-20-2016 per pt no recurrence and was release from oncologist), Hypothyroidism, postsurgical (1995), and Major depressive  disorder. here with     ICD-10-CM   1. OSA on CPAP  G47.33    Z99.89     [name] is doing fairly well with CPAP therapy. Leak has resolved with new FFM but she does not feel it is as comfortable. Compliance report reveals optimal compliance and acceptable leak. She was encouraged to continue CPAP therapy nightly and greater than 4 hours each night. She may consider returning to nasal mask with chin strap if she feels this will be more comfortable. She will continue monitoring for leak and call me with any concerns. She will follow up in 1 year, sooner if needed. She verbalizes understanding and agreement with this plan.    No orders of the defined types were placed in this encounter.    No orders of the defined types were placed in this encounter.     I spent 15 minutes with the patient. 50% of this time was spent counseling and educating patient on plan of care and medications.    Debbora Presto, FNP-C 08/06/2019, 1:38 PM [facility] [address] [place], [place] [address] [phone]  I reviewed the above note and documentation by the Nurse Practitioner and agree with the history, exam, assessment and plan as outlined above. I was available for consultation. Star Age, MD, PhD [facility])

## 2019-08-15 DIAGNOSIS — G4733 Obstructive sleep apnea (adult) (pediatric): Secondary | ICD-10-CM | POA: Insufficient documentation

## 2020-01-01 ENCOUNTER — Encounter: Payer: 59 | Admitting: Internal Medicine

## 2020-05-09 ENCOUNTER — Other Ambulatory Visit: Payer: Self-pay | Admitting: Internal Medicine

## 2020-06-10 ENCOUNTER — Telehealth: Payer: Self-pay | Admitting: Internal Medicine

## 2020-06-10 NOTE — Telephone Encounter (Signed)
[facility] nurse called and stated the patient is having chest pain that started late last night. Advised called EMS or go to ED - Patient has refused.

## 2020-06-10 NOTE — Telephone Encounter (Signed)
Did you speak to the patient to see if there was any other symptoms? We dont have any available appointments today so depending on what Dr. Sharlet Salina says about if shes having any other symptoms she would have to have a virtual visit with another office.

## 2020-06-10 NOTE — Telephone Encounter (Signed)
Patient is having chest pain when she inhales. Transferred call to Triage.

## 2020-06-10 NOTE — Telephone Encounter (Signed)
Pt c/o of dull chest discomfort on the left side that began last pm; increases when breathing in. Denies other symptoms (SOB or pain in other places).

## 2020-06-10 NOTE — Telephone Encounter (Signed)
See below

## 2020-06-10 NOTE — Telephone Encounter (Signed)
Can be with any office, did no [place] have visit today?

## 2020-06-10 NOTE — Telephone Encounter (Signed)
Visit Wednesday with someone please.

## 2020-06-10 NOTE — Telephone Encounter (Signed)
Depending on other symptoms would recommend visit with any available provider today

## 2020-06-11 ENCOUNTER — Ambulatory Visit: Payer: 59 | Admitting: Family Medicine

## 2020-06-11 ENCOUNTER — Encounter: Payer: Self-pay | Admitting: Family Medicine

## 2020-06-11 ENCOUNTER — Other Ambulatory Visit: Payer: Self-pay

## 2020-06-11 VITALS — BP 108/70 | HR 97 | Temp 97.7°F | Ht 62.0 in | Wt 177.4 lb

## 2020-06-11 DIAGNOSIS — R079 Chest pain, unspecified: Secondary | ICD-10-CM | POA: Diagnosis not present

## 2020-06-11 NOTE — Progress Notes (Signed)
Chief Complaint  Patient presents with  . chest when inhaling on monday night    [name] is a 55 y.o. female here for evaluation of L upper chest pain.  Duration of issue: 2 days  Quality: dull Palliation: none  Provocation: inhaling Not exertional.  Severity: 5/10 Radiation: upper L back Duration of chest pain: constant Associated symptoms: no SOB, N/V, a/w meals/position, jaw pain, inj, change in activity arm pain.  Cardiac history: none Family heart history: None in 1st degree Smoker? No  Past Medical History:  Diagnosis Date  . Anxiety   . Depression   . H/O total thyroidectomy 1995   for thyoid cancer  . History of adenomatous polyp of colon    tubular adenoma's 01-14-2011;  02-04-2016  . History of eating disorder    depression  . History of ovarian cyst    12-21-2013  at [facility]  lap. w/ lso and right salpingecotmy-- per path left ovary cystic teratoma and bilateral fallopian paratubal cyst's  . History of radiation to head and neck region 1995   thryoid cancer--- pt states no throat/ swallowing issues  . History of supraventricular tachycardia since age 26- had not seen a cardiologist until adulthood   sustained SVT since age 68-- has had ED visit's w/ adenosine and cardioversion in past/  05-10-2013  RFCA of AVNRT ablation by dr gregg taylor/  12-20-2016 per pt no not any issues, S&S since ablation  . History of thyroid cancer dx 1995--- 12-20-2016 per pt no recurrence and was release from oncologist   s/p  total thyroidectomy and radiation completed 1996  . Hypothyroidism, postsurgical 1995  . Major depressive disorder    Family History  Problem Relation Age of Onset  . Alzheimer's disease Mother        currently 35.  . Colon cancer Father 51       currently 23.  . Lung cancer Father   . Alzheimer's disease Maternal Grandmother   . Colon cancer Paternal Grandmother   . Colon cancer Paternal Grandfather   . Stomach cancer Neg Hx     BP 108/70  (BP Location: Left Arm, Patient Position: Sitting, Cuff Size: Normal)   Pulse 97   Temp 97.7 F (36.5 C) (Oral)   Ht 5\' 2"  (1.575 m)   Wt 177 lb 6 oz (80.5 kg)   SpO2 96%   BMI 32.44 kg/m  Gen: awake, alert, appears stated age HEENT: PERRLA, MMM Neck: No masses or asymmetry Heart: RRR, no bruits, no LE edema Lungs: CTAB, no accessory muscle use Abd: Soft, NT, ND, no masses or organomegaly MSK: chest pain is not reproducible to palptation Psych: Age appropriate judgment and insight, nml mood and affect  Chest pain, unspecified type - Plan: EKG 12-Lead  EKG shows NSR, normal axis, no interval abnormalities, no ST segment or T wave changes, good R wave progression.  Low cardiac risk. Lungs are clear. Offered CXR, though she would be low risk for both infection and PE. Likely pleuritic pain, stretches/exercises, ice/heat, NSAIDs, Tylenol. She will let me know if anything changes.  F/u prn. The patient voiced understanding and agreement to the plan.  [place], DO 06/11/20 11:22 AM

## 2020-06-11 NOTE — Telephone Encounter (Signed)
Fyi.

## 2020-06-11 NOTE — Patient Instructions (Signed)
OK to take Tylenol 1000 mg (2 extra strength tabs) or 975 mg (3 regular strength tabs) every 6 hours as needed.  Ibuprofen 400-600 mg (2-3 over the counter strength tabs) every 6 hours as needed for pain.  Ice/cold pack over area for 10-15 min twice daily.  Heat (pad or rice pillow in microwave) over affected area, 10-15 minutes twice daily.   Let us know if you need anything.   Pectoralis Major Rehab Ask your health care provider which exercises are safe for you. Do exercises exactly as told by your health care provider and adjust them as directed. It is normal to feel mild stretching, pulling, tightness, or discomfort as you do these exercises, but you should stop right away if you feel sudden pain or your pain gets worse.Do not begin these exercises until told by your health care provider. Stretching and range of motion exercises These exercises warm up your muscles and joints and improve the movement and flexibility of your shoulder. These exercises can also help to relieve pain, numbness, and tingling. Exercise A: Pendulum  1. Stand near a wall or a surface that you can hold onto for balance. 2. Bend at the waist and let your left / right arm hang straight down. Use your other arm to keep your balance. 3. Relax your arm and shoulder muscles, and move your hips and your trunk so your left / right arm swings freely. Your arm should swing because of the motion of your body, not because you are using your arm or shoulder muscles. 4. Keep moving so your arm swings in the following directions, as told by your health care provider: ? Side to side. ? Forward and backward. ? In clockwise and counterclockwise circles. 5. Slowly return to the starting position. Repeat 2 times. Complete this exercise 3 times per week. Exercise B: Abduction, standing 1. Stand and hold a broomstick, a cane, or a similar object. Place your hands a little more than shoulder-width apart on the object. Your left /  right hand should be palm-up, and your other hand should be palm-down. 2. While keeping your elbow straight and your shoulder muscles relaxed, push the stick across your body toward your left / right side. Raise your left / right arm to the side of your body and then over your head until you feel a stretch in your shoulder. ? Stop when you reach the angle that is recommended by your health care provider. ? Avoid shrugging your shoulder while you raise your arm. Keep your shoulder blade tucked down toward the middle of your spine. 3. Hold for 10 seconds. 4. Slowly return to the starting position. Repeat 2 times. Complete this exercise 3 times per week. Exercise C: Wand flexion, supine  1. Lie on your back. You may bend your knees for comfort. 2. Hold a broomstick, a cane, or a similar object so that your hands are about shoulder-width apart on the object. Your palms should face toward your feet. 3. Raise your left / right arm in front of your face, then behind your head (toward the floor). Use your other hand to help you do this. Stop when you feel a gentle stretch in your shoulder, or when you reach the angle that is recommended by your health care provider. 4. Hold for 3 seconds. 5. Use the broomstick and your other arm to help you return your left / right arm to the starting position. Repeat 2 times. Complete this exercise 3 times per week.  Exercise D: Wand shoulder external rotation 1. Stand and hold a broomstick, a cane, or a similar object so your handsare about shoulder-width apart on the object. 2. Start with your arms hanging down, then bend both elbows to an "L" shape (90 degrees). 3. Keep your left / right elbow at your side. Use your other hand to push the stick so your left / right forearm moves away from your body, out to your side. ? Keep your left / right elbow bent to 90 degrees and keep it against your side. ? Stop when you feel a gentle stretch in your shoulder, or when you  reach the angle recommended by your health care provider. 4. Hold for 10 seconds. 5. Use the stick to help you return your left / right arm to the starting position. Repeat 2 times. Complete this exercise 3 times per week. Strengthening exercises These exercises build strength and endurance in your shoulder. Endurance is the ability to use your muscles for a long time, even after your muscles get tired. Exercise E: Scapular protraction, standing 1. Stand so you are facing a wall. Place your feet about one arm-length away from the wall. 2. Place your hands on the wall and straighten your elbows. 3. Keep your hands on the wall as you push your upper back away from the wall. You should feel your shoulder blades sliding forward.Keep your elbows and your head still. ? If you are not sure that you are doing this exercise correctly, ask your health care provider for more instructions. 4. Hold for 3 seconds. 5. Slowly return to the starting position. Let your muscles relax completely before you repeat this exercise. Repeat 2 times. Complete this exercise 3 times per week. Exercise F: Shoulder blade squeezes  (scapular retraction) 1. Sit with good posture in a stable chair. Do not let your back touch the back of the chair. 2. Your arms should be at your sides with your elbows bent. You may rest your forearms on a pillow if that is more comfortable. 3. Squeeze your shoulder blades together. Bring them down and back. ? Keep your shoulders level. ? Do not lift your shoulders up toward your ears. 4. Hold for 3 seconds. 5. Return to the starting position. Repeat 2 times. Complete this exercise 3 times per week. This information is not intended to replace advice given to you by your health care provider. Make sure you discuss any questions you have with your health care provider. Document Released: 04/12/2005 Document Revised: 01/22/2016 Document Reviewed: 12/29/2014 Elsevier Interactive Patient  Education  [employer].

## 2020-06-11 NOTE — Telephone Encounter (Signed)
Patient is scheduled with Dr. Nani Ravens today in [facility] at [place] patient for Covid and provided directions and phone number to the office location.

## 2020-07-31 ENCOUNTER — Ambulatory Visit: Payer: 59 | Admitting: Family Medicine

## 2020-08-05 ENCOUNTER — Ambulatory Visit: Payer: 59 | Admitting: Family Medicine

## 2020-08-13 ENCOUNTER — Other Ambulatory Visit: Payer: Self-pay | Admitting: Internal Medicine

## 2020-09-26 ENCOUNTER — Other Ambulatory Visit: Payer: Self-pay

## 2020-09-26 ENCOUNTER — Ambulatory Visit (INDEPENDENT_AMBULATORY_CARE_PROVIDER_SITE_OTHER): Payer: 59 | Admitting: Internal Medicine

## 2020-09-26 ENCOUNTER — Encounter: Payer: Self-pay | Admitting: Internal Medicine

## 2020-09-26 VITALS — BP 116/80 | HR 77 | Temp 98.1°F | Resp 18 | Ht 62.0 in | Wt 173.2 lb

## 2020-09-26 DIAGNOSIS — Z0000 Encounter for general adult medical examination without abnormal findings: Secondary | ICD-10-CM | POA: Diagnosis not present

## 2020-09-26 DIAGNOSIS — E039 Hypothyroidism, unspecified: Secondary | ICD-10-CM | POA: Diagnosis not present

## 2020-09-26 DIAGNOSIS — B001 Herpesviral vesicular dermatitis: Secondary | ICD-10-CM

## 2020-09-26 LAB — URINALYSIS, ROUTINE W REFLEX MICROSCOPIC
Bilirubin Urine: NEGATIVE
Hgb urine dipstick: NEGATIVE
Ketones, ur: NEGATIVE
Nitrite: NEGATIVE
Specific Gravity, Urine: 1.01 (ref 1.000–1.030)
Total Protein, Urine: NEGATIVE
Urine Glucose: NEGATIVE
Urobilinogen, UA: 0.2 (ref 0.0–1.0)
pH: 7 (ref 5.0–8.0)

## 2020-09-26 LAB — TSH: TSH: 0.36 u[IU]/mL (ref 0.35–4.50)

## 2020-09-26 MED ORDER — LEVOTHYROXINE SODIUM 150 MCG PO TABS: 150.0000 ug | ORAL_TABLET | Freq: Every day | ORAL | 3 refills | Status: DC

## 2020-09-26 MED ORDER — VALACYCLOVIR HCL 1 G PO TABS: 1000.0000 mg | ORAL_TABLET | Freq: Two times a day (BID) | ORAL | 3 refills | Status: DC | PRN

## 2020-09-26 NOTE — Assessment & Plan Note (Signed)
Flu shot yearly. Covid-19 counseled 2 shots done. Shingrix complete. Tetanus up to date. Colonoscopy up to date due later this year. Mammogram up to date, pap smear up to date. Counseled about sun safety and mole surveillance. Counseled about the dangers of distracted driving. Given 10 year screening recommendations.

## 2020-09-26 NOTE — Assessment & Plan Note (Signed)
Checking TSH and adjust synthroid 150 mcg daily as needed. She has had bariatric surgery since our last visit so absorption may be impaired.

## 2020-09-26 NOTE — Progress Notes (Signed)
   Subjective:   Patient ID: [name], female    DOB: July 07, 1965, 55 y.o.   MRN: [id number]  HPI The patient is a 55 YO female coming in for physical.   PMH, [facility], social history reviewed and updated.   Review of Systems  Constitutional: Negative.   HENT: Negative.   Eyes: Negative.   Respiratory: Negative for cough, chest tightness and shortness of breath.   Cardiovascular: Negative for chest pain, palpitations and leg swelling.  Gastrointestinal: Negative for abdominal distention, abdominal pain, constipation, diarrhea, nausea and vomiting.  Musculoskeletal: Negative.   Skin: Negative.   Neurological: Negative.   Psychiatric/Behavioral: Negative.     Objective:  Physical Exam Constitutional:      Appearance: She is well-developed.  HENT:     Head: Normocephalic and atraumatic.  Cardiovascular:     Rate and Rhythm: Normal rate and regular rhythm.  Pulmonary:     Effort: Pulmonary effort is normal. No respiratory distress.     Breath sounds: Normal breath sounds. No wheezing or rales.  Abdominal:     General: Bowel sounds are normal. There is no distension.     Palpations: Abdomen is soft.     Tenderness: There is no abdominal tenderness. There is no rebound.  Musculoskeletal:     Cervical back: Normal range of motion.  Skin:    General: Skin is warm and dry.  Neurological:     Mental Status: She is alert and oriented to person, place, and time.     Coordination: Coordination normal.     Vitals:   09/26/20 1024  BP: 116/80  Pulse: 77  Resp: 18  Temp: 98.1 F (36.7 C)  TempSrc: Oral  SpO2: 97%  Weight: 173 lb 3.2 oz (78.6 kg)  Height: 5\' 2"  (1.575 m)    This visit occurred during the SARS-CoV-2 public health emergency.  Safety protocols were in place, including screening questions prior to the visit, additional usage of staff PPE, and extensive cleaning of exam room while observing appropriate contact time as indicated for disinfecting solutions.    Assessment & Plan:

## 2020-09-26 NOTE — Patient Instructions (Signed)
Health Maintenance, Female Adopting a healthy lifestyle and getting preventive care are important in promoting health and wellness. Ask your health care provider about:  The right schedule for you to have regular tests and exams.  Things you can do on your own to prevent diseases and keep yourself healthy. What should I know about diet, weight, and exercise? Eat a healthy diet  Eat a diet that includes plenty of vegetables, fruits, low-fat dairy products, and lean protein.  Do not eat a lot of foods that are high in solid fats, added sugars, or sodium.   Maintain a healthy weight Body mass index (BMI) is used to identify weight problems. It estimates body fat based on height and weight. Your health care provider can help determine your BMI and help you achieve or maintain a healthy weight. Get regular exercise Get regular exercise. This is one of the most important things you can do for your health. Most adults should:  Exercise for at least 150 minutes each week. The exercise should increase your heart rate and make you sweat (moderate-intensity exercise).  Do strengthening exercises at least twice a week. This is in addition to the moderate-intensity exercise.  Spend less time sitting. Even light physical activity can be beneficial. Watch cholesterol and blood lipids Have your blood tested for lipids and cholesterol at 57 years of age, then have this test every 5 years. Have your cholesterol levels checked more often if:  Your lipid or cholesterol levels are high.  You are older than 57 years of age.  You are at high risk for heart disease. What should I know about cancer screening? Depending on your health history and family history, you may need to have cancer screening at various ages. This may include screening for:  Breast cancer.  Cervical cancer.  Colorectal cancer.  Skin cancer.  Lung cancer. What should I know about heart disease, diabetes, and high blood  pressure? Blood pressure and heart disease  High blood pressure causes heart disease and increases the risk of stroke. This is more likely to develop in people who have high blood pressure readings, are of African descent, or are overweight.  Have your blood pressure checked: ? Every 3-5 years if you are 18-39 years of age. ? Every year if you are 40 years old or older. Diabetes Have regular diabetes screenings. This checks your fasting blood sugar level. Have the screening done:  Once every three years after age 40 if you are at a normal weight and have a low risk for diabetes.  More often and at a younger age if you are overweight or have a high risk for diabetes. What should I know about preventing infection? Hepatitis B If you have a higher risk for hepatitis B, you should be screened for this virus. Talk with your health care provider to find out if you are at risk for hepatitis B infection. Hepatitis C Testing is recommended for:  Everyone born from 1945 through 1965.  Anyone with known risk factors for hepatitis C. Sexually transmitted infections (STIs)  Get screened for STIs, including gonorrhea and chlamydia, if: ? You are sexually active and are younger than 57 years of age. ? You are older than 57 years of age and your health care provider tells you that you are at risk for this type of infection. ? Your sexual activity has changed since you were last screened, and you are at increased risk for chlamydia or gonorrhea. Ask your health care provider   if you are at risk.  Ask your health care provider about whether you are at high risk for HIV. Your health care provider may recommend a prescription medicine to help prevent HIV infection. If you choose to take medicine to prevent HIV, you should first get tested for HIV. You should then be tested every 3 months for as long as you are taking the medicine. Pregnancy  If you are about to stop having your period (premenopausal) and  you may become pregnant, seek counseling before you get pregnant.  Take 400 to 800 micrograms (mcg) of folic acid every day if you become pregnant.  Ask for birth control (contraception) if you want to prevent pregnancy. Osteoporosis and menopause Osteoporosis is a disease in which the bones lose minerals and strength with aging. This can result in bone fractures. If you are 65 years old or older, or if you are at risk for osteoporosis and fractures, ask your health care provider if you should:  Be screened for bone loss.  Take a calcium or vitamin D supplement to lower your risk of fractures.  Be given hormone replacement therapy (HRT) to treat symptoms of menopause. Follow these instructions at home: Lifestyle  Do not use any products that contain nicotine or tobacco, such as cigarettes, e-cigarettes, and chewing tobacco. If you need help quitting, ask your health care provider.  Do not use street drugs.  Do not share needles.  Ask your health care provider for help if you need support or information about quitting drugs. Alcohol use  Do not drink alcohol if: ? Your health care provider tells you not to drink. ? You are pregnant, may be pregnant, or are planning to become pregnant.  If you drink alcohol: ? Limit how much you use to 0-1 drink a day. ? Limit intake if you are breastfeeding.  Be aware of how much alcohol is in your drink. In the [place] one drink equals one 12 oz bottle of beer (355 mL), one 5 oz glass of wine (148 mL), or one 1 oz glass of hard liquor (44 mL). General instructions  Schedule regular health, dental, and eye exams.  Stay current with your vaccines.  Tell your health care provider if: ? You often feel depressed. ? You have ever been abused or do not feel safe at home. Summary  Adopting a healthy lifestyle and getting preventive care are important in promoting health and wellness.  Follow your health care provider's instructions about healthy  diet, exercising, and getting tested or screened for diseases.  Follow your health care provider's instructions on monitoring your cholesterol and blood pressure. This information is not intended to replace advice given to you by your health care provider. Make sure you discuss any questions you have with your health care provider. Document Revised: 04/05/2018 Document Reviewed: 04/05/2018 Elsevier Patient Education  2021 Elsevier Inc.  

## 2020-09-26 NOTE — Assessment & Plan Note (Signed)
Refill valtrex which she takes prn.

## 2020-09-28 LAB — URINE CULTURE

## 2020-09-29 LAB — LIPID PANEL
Cholesterol: 200 mg/dL (ref 0–200)
HDL: 62.2 mg/dL (ref >39.00)
LDL Cholesterol: 120 mg/dL — ABNORMAL HIGH (ref 0–99)
NonHDL: 137.75
Total CHOL/HDL Ratio: 3
Triglycerides: 87 mg/dL (ref 0.0–149.0)
VLDL: 17.4 mg/dL (ref 0.0–40.0)

## 2020-11-27 ENCOUNTER — Encounter: Payer: Self-pay | Admitting: Internal Medicine

## 2020-11-27 ENCOUNTER — Ambulatory Visit: Payer: 59 | Admitting: Internal Medicine

## 2020-11-27 ENCOUNTER — Other Ambulatory Visit: Payer: Self-pay

## 2020-11-27 VITALS — BP 120/70 | HR 77 | Temp 98.4°F | Resp 18 | Ht 62.0 in | Wt 153.6 lb

## 2020-11-27 DIAGNOSIS — R309 Painful micturition, unspecified: Secondary | ICD-10-CM

## 2020-11-27 LAB — POCT URINALYSIS DIPSTICK
Blood, UA: NEGATIVE
Glucose, UA: NEGATIVE
Ketones, UA: NEGATIVE
Nitrite, UA: POSITIVE
Protein, UA: POSITIVE — AB
Spec Grav, UA: 1.01 (ref 1.010–1.025)
Urobilinogen, UA: NEGATIVE E.U./dL — AB
pH, UA: 7 (ref 5.0–8.0)

## 2020-11-27 MED ORDER — SULFAMETHOXAZOLE-TRIMETHOPRIM 800-160 MG PO TABS: 1.0000 | ORAL_TABLET | Freq: Two times a day (BID) | ORAL | 0 refills | Status: DC

## 2020-11-27 NOTE — Assessment & Plan Note (Signed)
POC U/A consistent with cystitis. Rx bactrim 5 day course.

## 2020-11-27 NOTE — Patient Instructions (Signed)
You do have signs of infection in the urine and we have sent in bactrim to take 1 pill twice a day for 5 days.

## 2020-11-27 NOTE — Progress Notes (Signed)
   Subjective:   Patient ID: [name], female    DOB: Jan 08, 1966, 55 y.o.   MRN: [id number]  HPI The patient is a 55 YO female coming in for possible UTI. Has had these in the past and feels similar. Started about 1 week ago. Denies chills, vomiting, back pain.   Review of Systems  Constitutional: Negative.   Respiratory: Negative.    Cardiovascular: Negative.   Gastrointestinal:  Positive for abdominal pain. Negative for abdominal distention, constipation, diarrhea, nausea and vomiting.  Genitourinary:  Positive for dysuria, frequency and urgency.  Musculoskeletal: Negative.   Skin: Negative.    Objective:  Physical Exam Constitutional:      Appearance: She is well-developed.  HENT:     Head: Normocephalic and atraumatic.  Cardiovascular:     Rate and Rhythm: Normal rate and regular rhythm.  Pulmonary:     Effort: Pulmonary effort is normal. No respiratory distress.     Breath sounds: Normal breath sounds. No wheezing or rales.  Abdominal:     General: Bowel sounds are normal. There is no distension.     Palpations: Abdomen is soft.     Tenderness: There is no abdominal tenderness. There is no rebound.  Musculoskeletal:     Cervical back: Normal range of motion.  Skin:    General: Skin is warm and dry.  Neurological:     Mental Status: She is alert and oriented to person, place, and time.     Coordination: Coordination normal.    Vitals:   11/27/20 1001  BP: 120/70  Pulse: 77  Resp: 18  Temp: 98.4 F (36.9 C)  TempSrc: Oral  SpO2: 99%  Weight: 153 lb 9.6 oz (69.7 kg)  Height: '5\' 2"'$  (1.575 m)    This visit occurred during the SARS-CoV-2 public health emergency.  Safety protocols were in place, including screening questions prior to the visit, additional usage of staff PPE, and extensive cleaning of exam room while observing appropriate contact time as indicated for disinfecting solutions.   Assessment & Plan:

## 2021-02-12 ENCOUNTER — Telehealth: Payer: Self-pay | Admitting: Internal Medicine

## 2021-02-12 NOTE — Telephone Encounter (Signed)
See below

## 2021-02-12 NOTE — Telephone Encounter (Signed)
Pharmacy calling in requesting to change manufacturer brand for levothyroxine (SYNTHROID) 150 MCG tablet .. says it now needs to be the [facility] brand instead of [facility]  Please call pharmacy back

## 2021-02-13 NOTE — Telephone Encounter (Signed)
Ok for brand switch

## 2021-02-13 NOTE — Telephone Encounter (Signed)
I see it looks like [facility] called [place], we sent 1 year supply to [facility] in June. If she has changed pharmacies okay to send refill.

## 2021-02-13 NOTE — Telephone Encounter (Signed)
Spoke with the pharmacist at [facility] and she stated that there is no current prescription for her levothyroxine. Ok to send over a new script under the preferred brand? Please advise

## 2021-03-03 ENCOUNTER — Encounter: Payer: Self-pay | Admitting: Gastroenterology

## 2021-05-11 ENCOUNTER — Telehealth: Payer: Self-pay | Admitting: Internal Medicine

## 2021-05-11 NOTE — Telephone Encounter (Signed)
Patient stating pharmacy will not fill rx due to not receiving verbals   Informed patient of assistant's message that "okay" verbals was given to pharmacy today 05-11-2021  *see below*  Patient states she will call pharmacy back

## 2021-05-11 NOTE — Telephone Encounter (Signed)
Okay to change? 

## 2021-05-11 NOTE — Telephone Encounter (Signed)
See below

## 2021-05-11 NOTE — Telephone Encounter (Signed)
Pharmacy requesting verbal auth to switch manufacture for levothyroxine (SYNTHROID) 150 MCG tablet  Please advise  Phone [phone]- Suanne Marker

## 2021-05-11 NOTE — Telephone Encounter (Signed)
Spoke with pharmacy to give verbals to switch levothyroxine due to manufactures

## 2021-10-29 ENCOUNTER — Encounter: Payer: Self-pay | Admitting: Internal Medicine

## 2021-10-29 ENCOUNTER — Ambulatory Visit (INDEPENDENT_AMBULATORY_CARE_PROVIDER_SITE_OTHER): Payer: 59 | Admitting: Internal Medicine

## 2021-10-29 VITALS — BP 120/80 | HR 58 | Resp 18 | Ht 62.0 in | Wt 178.6 lb

## 2021-10-29 DIAGNOSIS — Z23 Encounter for immunization: Secondary | ICD-10-CM | POA: Diagnosis not present

## 2021-10-29 DIAGNOSIS — R350 Frequency of micturition: Secondary | ICD-10-CM | POA: Diagnosis not present

## 2021-10-29 DIAGNOSIS — B001 Herpesviral vesicular dermatitis: Secondary | ICD-10-CM

## 2021-10-29 DIAGNOSIS — Z0000 Encounter for general adult medical examination without abnormal findings: Secondary | ICD-10-CM | POA: Diagnosis not present

## 2021-10-29 DIAGNOSIS — Z1231 Encounter for screening mammogram for malignant neoplasm of breast: Secondary | ICD-10-CM

## 2021-10-29 DIAGNOSIS — R309 Painful micturition, unspecified: Secondary | ICD-10-CM

## 2021-10-29 DIAGNOSIS — R300 Dysuria: Secondary | ICD-10-CM

## 2021-10-29 DIAGNOSIS — Z86010 Personal history of colonic polyps: Secondary | ICD-10-CM

## 2021-10-29 DIAGNOSIS — E039 Hypothyroidism, unspecified: Secondary | ICD-10-CM

## 2021-10-29 LAB — POCT URINALYSIS DIPSTICK
Bilirubin, UA: NEGATIVE
Glucose, UA: NEGATIVE
Ketones, UA: NEGATIVE
Nitrite, UA: NEGATIVE
Protein, UA: POSITIVE — AB
Spec Grav, UA: 1.015 (ref 1.010–1.025)
Urobilinogen, UA: 0.2 E.U./dL
pH, UA: 7.5 (ref 5.0–8.0)

## 2021-10-29 LAB — CBC
HCT: 43.2 % (ref 36.0–46.0)
Hemoglobin: 14.2 g/dL (ref 12.0–15.0)
MCHC: 32.9 g/dL (ref 30.0–36.0)
MCV: 91.5 fl (ref 78.0–100.0)
Platelets: 240 10*3/uL (ref 150.0–400.0)
RBC: 4.72 Mil/uL (ref 3.87–5.11)
RDW: 13.6 % (ref 11.5–15.5)
WBC: 5.6 10*3/uL (ref 4.0–10.5)

## 2021-10-29 LAB — TSH: TSH: 7.02 u[IU]/mL — ABNORMAL HIGH (ref 0.35–5.50)

## 2021-10-29 LAB — COMPREHENSIVE METABOLIC PANEL
ALT: 18 U/L (ref 0–35)
AST: 19 U/L (ref 0–37)
Albumin: 4 g/dL (ref 3.5–5.2)
Alkaline Phosphatase: 85 U/L (ref 39–117)
BUN: 9 mg/dL (ref 6–23)
CO2: 30 mEq/L (ref 19–32)
Calcium: 9 mg/dL (ref 8.4–10.5)
Chloride: 104 mEq/L (ref 96–112)
Creatinine, Ser: 0.79 mg/dL (ref 0.40–1.20)
GFR: 82.63 mL/min (ref >60.00)
Glucose, Bld: 90 mg/dL (ref 70–99)
Potassium: 4.1 mEq/L (ref 3.5–5.1)
Sodium: 140 mEq/L (ref 135–145)
Total Bilirubin: 0.4 mg/dL (ref 0.2–1.2)
Total Protein: 6.4 g/dL (ref 6.0–8.3)

## 2021-10-29 LAB — LIPID PANEL
Cholesterol: 162 mg/dL (ref 0–200)
HDL: 62.9 mg/dL (ref >39.00)
LDL Cholesterol: 84 mg/dL (ref 0–99)
NonHDL: 99.2
Total CHOL/HDL Ratio: 3
Triglycerides: 77 mg/dL (ref 0.0–149.0)
VLDL: 15.4 mg/dL (ref 0.0–40.0)

## 2021-10-29 LAB — T4, FREE: Free T4: 0.53 ng/dL — ABNORMAL LOW (ref 0.60–1.60)

## 2021-10-29 LAB — HEMOGLOBIN A1C: Hgb A1c MFr Bld: 5.2 % (ref 4.6–6.5)

## 2021-10-29 MED ORDER — LEVOTHYROXINE SODIUM 150 MCG PO TABS: 150.0000 ug | ORAL_TABLET | Freq: Every day | ORAL | 3 refills | Status: DC

## 2021-10-29 MED ORDER — NITROFURANTOIN MONOHYD MACRO 100 MG PO CAPS: ORAL_CAPSULE | ORAL | 2 refills | Status: DC

## 2021-10-29 MED ORDER — VALACYCLOVIR HCL 1 G PO TABS: 1000.0000 mg | ORAL_TABLET | Freq: Two times a day (BID) | ORAL | 3 refills | Status: DC | PRN

## 2021-10-29 NOTE — Assessment & Plan Note (Signed)
Recurrent UTI and POC U/A consistent with same. Rx macrobid 1 week treatment and then prn with intercourse rx done.

## 2021-10-29 NOTE — Progress Notes (Signed)
   Subjective:   Patient ID: [name], female    DOB: 1966-04-04, 56 y.o.   MRN: [id number]  HPI The patient is here for physical.  PMH, [facility], social history reviewed and updated  Review of Systems  Constitutional: Negative.   HENT: Negative.    Eyes: Negative.   Respiratory:  Negative for cough, chest tightness and shortness of breath.   Cardiovascular:  Negative for chest pain, palpitations and leg swelling.  Gastrointestinal:  Negative for abdominal distention, abdominal pain, constipation, diarrhea, nausea and vomiting.  Musculoskeletal: Negative.   Skin: Negative.   Neurological: Negative.   Psychiatric/Behavioral: Negative.      Objective:  Physical Exam Constitutional:      Appearance: She is well-developed.  HENT:     Head: Normocephalic and atraumatic.  Cardiovascular:     Rate and Rhythm: Normal rate and regular rhythm.  Pulmonary:     Effort: Pulmonary effort is normal. No respiratory distress.     Breath sounds: Normal breath sounds. No wheezing or rales.  Abdominal:     General: Bowel sounds are normal. There is no distension.     Palpations: Abdomen is soft.     Tenderness: There is no abdominal tenderness. There is no rebound.  Musculoskeletal:     Cervical back: Normal range of motion.  Skin:    General: Skin is warm and dry.  Neurological:     Mental Status: She is alert and oriented to person, place, and time.     Coordination: Coordination normal.     Vitals:   10/29/21 1045  BP: 120/80  Pulse: (!) 58  Resp: 18  SpO2: 97%  Weight: 178 lb 9.6 oz (81 kg)  Height: '5\' 2"'$  (1.575 m)    Assessment & Plan:   Tdap given at visit

## 2021-10-29 NOTE — Assessment & Plan Note (Signed)
Flu shot yearly. Covid-19 counseled. Shingrix complete. Tetanus given at visit. Colonoscopy overdue referral placed. Mammogram ordered overdue, pap smear overdue offered to do here or at her gyn. Counseled about sun safety and mole surveillance. Counseled about the dangers of distracted driving. Given 10 year screening recommendations.

## 2021-10-29 NOTE — Assessment & Plan Note (Signed)
Checking TSH and free T4 and she has run out levothyroxine about 1 month ago. Refilled 150 mcg daily levothyroxine. Adjust as needed.

## 2021-10-29 NOTE — Patient Instructions (Signed)
We will send in macrobid to take 1 pill twice a day for 1 week then as needed.

## 2021-10-29 NOTE — Assessment & Plan Note (Signed)
Refilled valtrex which she uses continuously for suppression. She has been out for 1 month and has flare.

## 2021-10-30 LAB — LITHIUM LEVEL: Lithium Lvl: <0.3 mmol/L — ABNORMAL LOW (ref 0.6–1.2)

## 2021-11-12 ENCOUNTER — Encounter: Payer: Self-pay | Admitting: Gastroenterology

## 2021-12-08 ENCOUNTER — Ambulatory Visit (AMBULATORY_SURGERY_CENTER): Payer: 59 | Admitting: *Deleted

## 2021-12-08 VITALS — Ht 62.0 in | Wt 170.0 lb

## 2021-12-08 DIAGNOSIS — Z800 Family history of malignant neoplasm of digestive organs: Secondary | ICD-10-CM

## 2021-12-08 DIAGNOSIS — Z86010 Personal history of colonic polyps: Secondary | ICD-10-CM

## 2021-12-08 MED ORDER — NA SULFATE-K SULFATE-MG SULF 17.5-3.13-1.6 GM/177ML PO SOLN: 1.0000 | Freq: Once | ORAL | 0 refills | Status: AC

## 2021-12-08 NOTE — Progress Notes (Signed)
No egg or soy allergy known to patient  No issues known to pt with past sedation with any surgeries or procedures Patient denies ever being told they had issues or difficulty with intubation  No FH of Malignant Hyperthermia Pt is not on diet pills Pt is not on home 02  Pt is not on blood thinners  Pt denies issues with constipation  No A fib or A flutter Have any cardiac testing pending--NO Pt instructed to use [email or web address] or GoodRx for a price reduction on prep   

## 2021-12-19 IMAGING — DX DG CHEST 2V
2 series · 2 of 2 positions shown · non-contrast
Comparison: None.

CLINICAL DATA: Preoperative examination. Patient for abdominal
surgery.

EXAM:
CHEST - 2 VIEW

[chest pa]
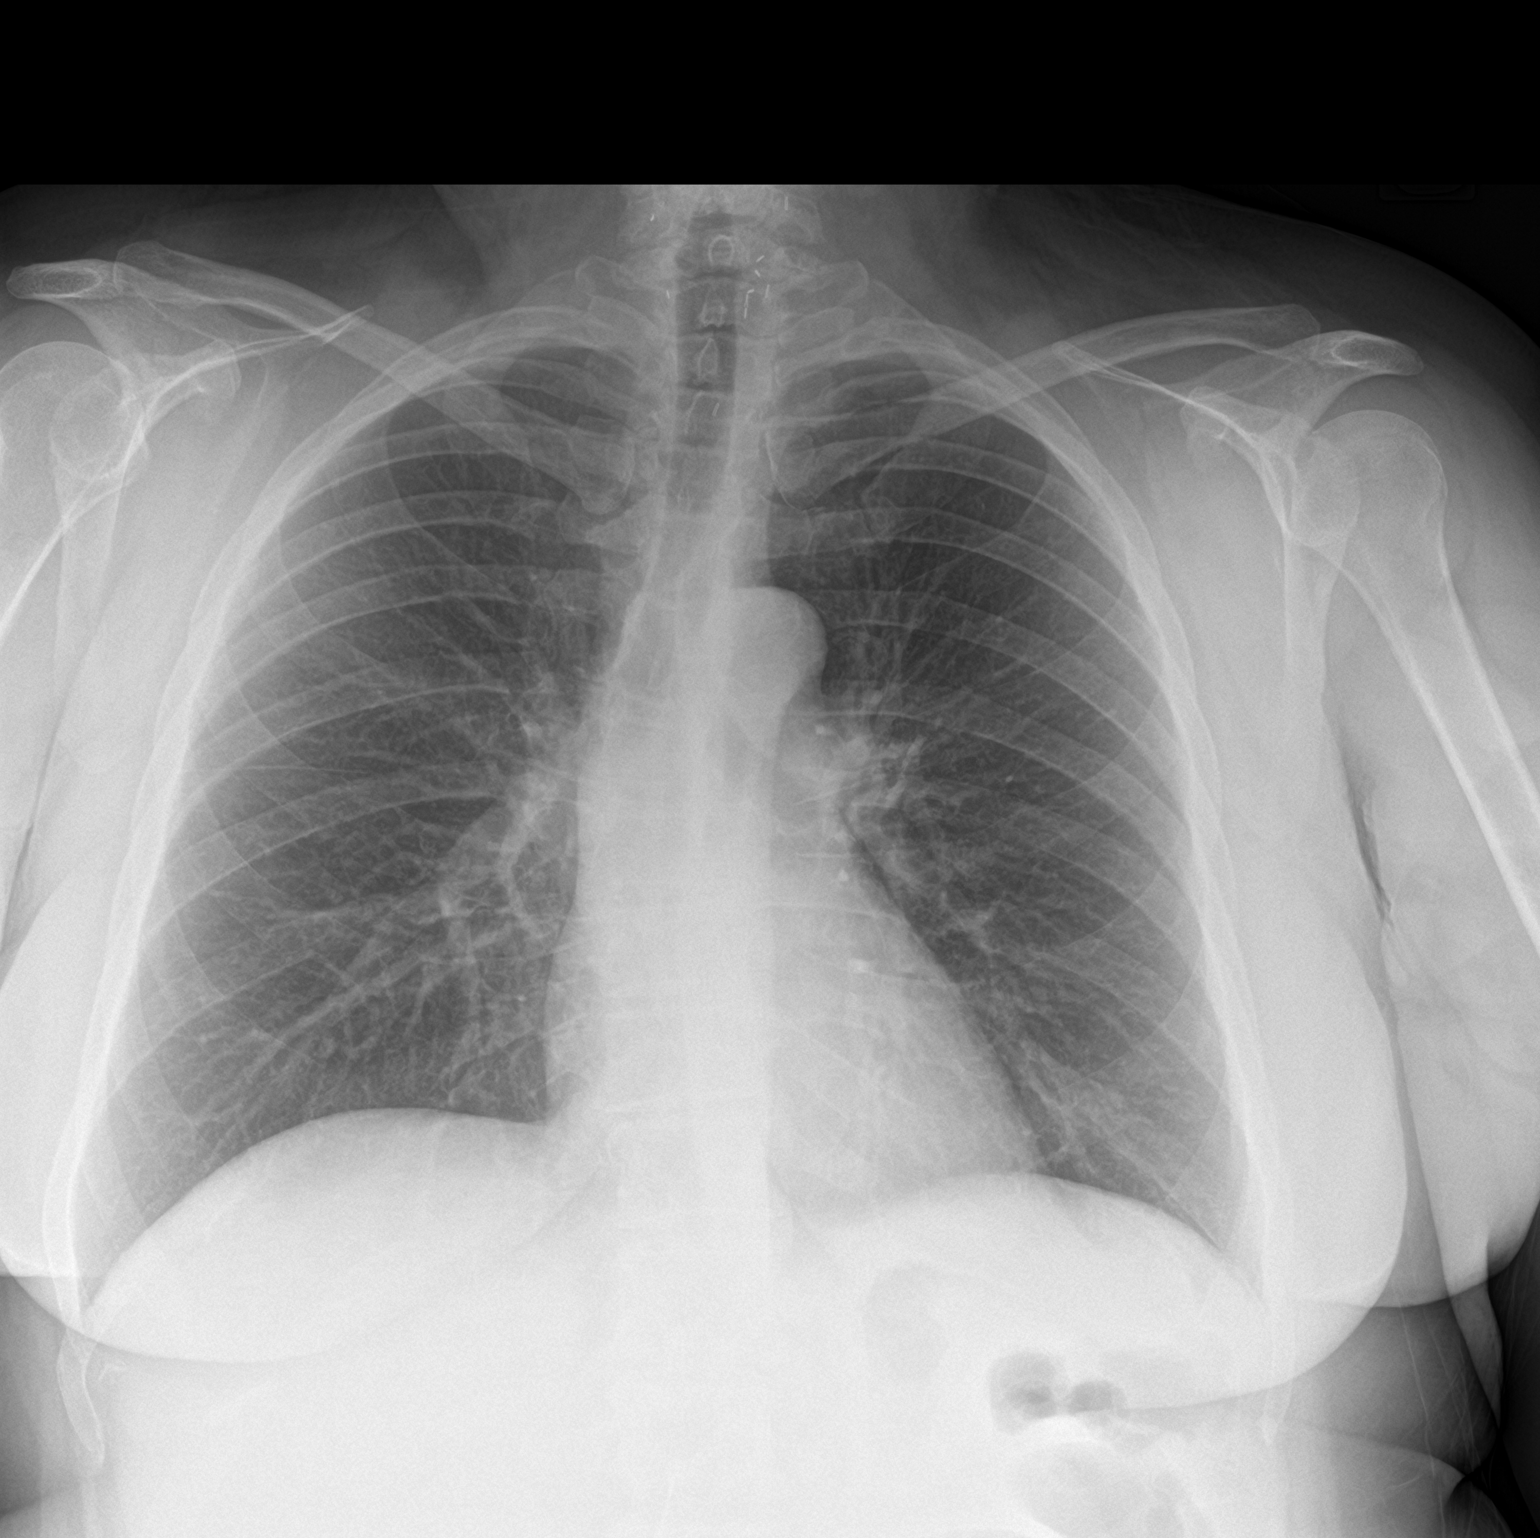

[chest lat]
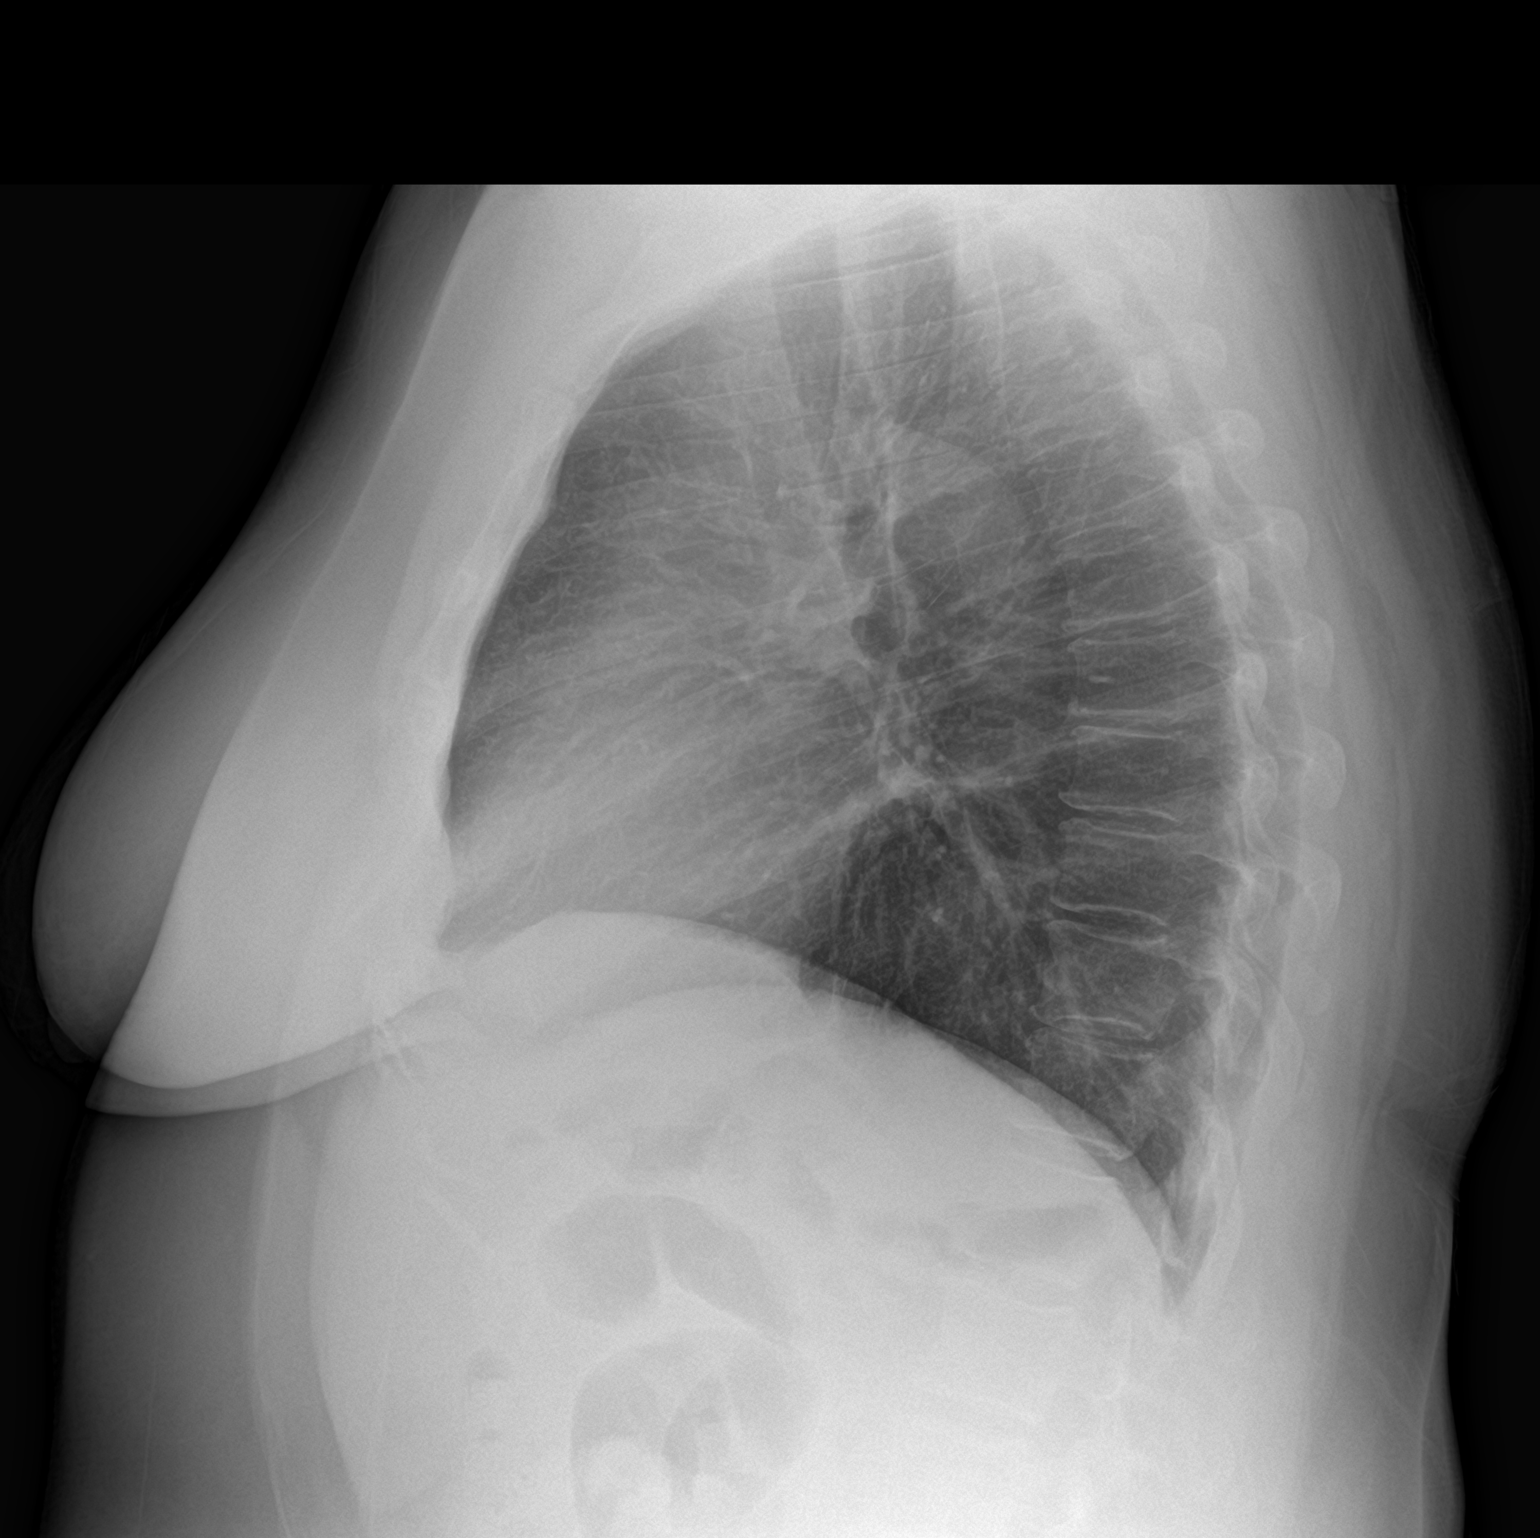

[2 of 2 positions shown; findings below may reference images not displayed]

FINDINGS: The lungs are clear. Heart size is normal. No pneumothorax or
pleural fluid. No acute or focal bony abnormality.
IMPRESSION: Negative chest.

## 2021-12-31 ENCOUNTER — Encounter: Payer: Self-pay | Admitting: Internal Medicine

## 2021-12-31 ENCOUNTER — Ambulatory Visit: Payer: BC Managed Care – PPO | Admitting: Internal Medicine

## 2021-12-31 DIAGNOSIS — M79646 Pain in unspecified finger(s): Secondary | ICD-10-CM | POA: Insufficient documentation

## 2021-12-31 DIAGNOSIS — M79645 Pain in left finger(s): Secondary | ICD-10-CM

## 2021-12-31 MED ORDER — CEPHALEXIN 500 MG PO CAPS: 500.0000 mg | ORAL_CAPSULE | Freq: Two times a day (BID) | ORAL | 0 refills | Status: AC

## 2021-12-31 NOTE — Progress Notes (Signed)
   Subjective:   Patient ID: [name], female    DOB: [birth year], 56 y.o.   MRN: [id number]  HPI The patient is a 56 YO female coming in for infected fingernail. Started about a week ago. She did try to drain in 2 days ago and some drainage came out.  Review of Systems  Constitutional: Negative.   HENT: Negative.    Eyes: Negative.   Respiratory:  Negative for cough, chest tightness and shortness of breath.   Cardiovascular:  Negative for chest pain, palpitations and leg swelling.  Gastrointestinal:  Negative for abdominal distention, abdominal pain, constipation, diarrhea, nausea and vomiting.  Musculoskeletal:  Positive for joint swelling and myalgias.  Skin: Negative.   Neurological: Negative.   Psychiatric/Behavioral: Negative.      Objective:  Physical Exam Constitutional:      Appearance: She is well-developed.  HENT:     Head: Normocephalic and atraumatic.  Cardiovascular:     Rate and Rhythm: Normal rate and regular rhythm.  Pulmonary:     Effort: Pulmonary effort is normal. No respiratory distress.     Breath sounds: Normal breath sounds. No wheezing or rales.  Abdominal:     General: Bowel sounds are normal. There is no distension.     Palpations: Abdomen is soft.     Tenderness: There is no abdominal tenderness. There is no rebound.  Musculoskeletal:     Cervical back: Normal range of motion.     Comments: Swelling finger left hand with redness and scab from drainage site. No purulence or fluctuance  Skin:    General: Skin is warm and dry.  Neurological:     Mental Status: She is alert and oriented to person, place, and time.     Coordination: Coordination normal.     Vitals:   12/31/21 1006  BP: 118/80  Pulse: 66  Temp: 98.4 F (36.9 C)  TempSrc: Oral  SpO2: 98%  Weight: 180 lb (81.6 kg)  Height: '5\' 2"'$  (1.575 m)    Assessment & Plan:

## 2021-12-31 NOTE — Assessment & Plan Note (Signed)
From infected cuticle likely after cuticle trimming. No indication for I and D today. Rx keflex 5 day course. Use ice and tylenol for pain.

## 2021-12-31 NOTE — Patient Instructions (Signed)
We have sent in the keflex to take 1 pill twice a day for 5 days.

## 2022-01-05 ENCOUNTER — Encounter: Payer: Self-pay | Admitting: Gastroenterology

## 2022-01-05 ENCOUNTER — Ambulatory Visit (AMBULATORY_SURGERY_CENTER): Payer: BC Managed Care – PPO | Admitting: Gastroenterology

## 2022-01-05 VITALS — BP 147/83 | HR 63 | Temp 98.0°F | Resp 13 | Ht 62.0 in | Wt 170.0 lb

## 2022-01-05 DIAGNOSIS — Z09 Encounter for follow-up examination after completed treatment for conditions other than malignant neoplasm: Secondary | ICD-10-CM | POA: Diagnosis not present

## 2022-01-05 DIAGNOSIS — D123 Benign neoplasm of transverse colon: Secondary | ICD-10-CM

## 2022-01-05 DIAGNOSIS — Z86010 Personal history of colonic polyps: Secondary | ICD-10-CM | POA: Diagnosis not present

## 2022-01-05 DIAGNOSIS — Z1211 Encounter for screening for malignant neoplasm of colon: Secondary | ICD-10-CM | POA: Diagnosis not present

## 2022-01-05 MED ORDER — SODIUM CHLORIDE 0.9 % IV SOLN: 500.0000 mL | INTRAVENOUS | Status: DC

## 2022-01-05 NOTE — Patient Instructions (Signed)
YOU HAD AN ENDOSCOPIC PROCEDURE TODAY AT [facility]:   Refer to the procedure report that was given to you for any specific questions about what was found during the examination.  If the procedure report does not answer your questions, please call your gastroenterologist to clarify.  If you requested that your care partner not be given the details of your procedure findings, then the procedure report has been included in a sealed envelope for you to review at your convenience later.  Read all of the handouts given to you by your recovery room nurse.  Use Gas-X as needed.  YOU SHOULD EXPECT: Some feelings of bloating in the abdomen. Passage of more gas than usual.  Walking can help get rid of the air that was put into your GI tract during the procedure and reduce the bloating. If you had a lower endoscopy (such as a colonoscopy or flexible sigmoidoscopy) you may notice spotting of blood in your stool or on the toilet paper. If you underwent a bowel prep for your procedure, you may not have a normal bowel movement for a few days.  Please Note:  You might notice some irritation and congestion in your nose or some drainage.  This is from the oxygen used during your procedure.  There is no need for concern and it should clear up in a day or so.  SYMPTOMS TO REPORT IMMEDIATELY:  Following lower endoscopy (colonoscopy or flexible sigmoidoscopy):  Excessive amounts of blood in the stool  Significant tenderness or worsening of abdominal pains  Swelling of the abdomen that is new, acute  Fever of 100F or higher  For urgent or emergent issues, a gastroenterologist can be reached at any hour by calling [phone]. Do not use MyChart messaging for urgent concerns.    DIET:  We do recommend a small meal at first, but then you may proceed to your regular diet.  Drink plenty of fluids but you should avoid alcoholic beverages for 24 hours.  ACTIVITY:  You should plan to take it easy for  the rest of today and you should NOT DRIVE or use heavy machinery until tomorrow (because of the sedation medicines used during the test).    FOLLOW UP: Our staff will call the number listed on your records the next business day following your procedure.  We will call around 7:15- 8:00 am to check on you and address any questions or concerns that you may have regarding the information given to you following your procedure. If we do not reach you, we will leave a message.     If any biopsies were taken you will be contacted by phone or by letter within the next 1-3 weeks.  Please call us at [phone] if you have not heard about the biopsies in 3 weeks.    SIGNATURES/CONFIDENTIALITY: You and/or your care partner have signed paperwork which will be entered into your electronic medical record.  These signatures attest to the fact that that the information above on your After Visit Summary has been reviewed and is understood.  Full responsibility of the confidentiality of this discharge information lies with you and/or your care-partner.

## 2022-01-05 NOTE — Op Note (Signed)
[facility] Patient Name: [name] Procedure Date: 01/05/2022 10:51 AM MRN: [id number] Endoscopist: Mauri Pole , MD Age: 56 Referring MD:  Date of Birth: [birth year] Gender: Female Account #: [id number] Procedure:                Colonoscopy Indications:              High risk colon cancer surveillance: Personal                            history of colonic polyps, High risk colon cancer                            surveillance: Personal history of adenoma less than                            10 mm in size Medicines:                Monitored Anesthesia Care Procedure:                Pre-Anesthesia Assessment:                           - Prior to the procedure, a History and Physical                            was performed, and patient medications and                            allergies were reviewed. The patient's tolerance of                            previous anesthesia was also reviewed. The risks                            and benefits of the procedure and the sedation                            options and risks were discussed with the patient.                            All questions were answered, and informed consent                            was obtained. Prior Anticoagulants: The patient has                            taken no previous anticoagulant or antiplatelet                            agents. ASA Grade Assessment: II - A patient with                            mild systemic disease. After reviewing the risks  and benefits, the patient was deemed in                            satisfactory condition to undergo the procedure.                           After obtaining informed consent, the colonoscope                            was passed under direct vision. Throughout the                            procedure, the patient's blood pressure, pulse, and                            oxygen saturations were monitored  continuously. The                            PCF-HQ190L Colonoscope was introduced through the                            anus and advanced to the the cecum, identified by                            appendiceal orifice and ileocecal valve. The                            colonoscopy was performed without difficulty. The                            patient tolerated the procedure well. The quality                            of the bowel preparation was good. The ileocecal                            valve, appendiceal orifice, and rectum were                            photographed. Scope In: 10:57:46 AM Scope Out: 11:17:58 AM Scope Withdrawal Time: 0 hours 9 minutes 21 seconds  Total Procedure Duration: 0 hours 20 minutes 12 seconds  Findings:                 The perianal and digital rectal examinations were                            normal.                           A 11 mm polyp was found in the hepatic flexure. The                            polyp was sessile. The polyp was removed with a  cold snare. Resection and retrieval were complete.                           Non-bleeding external and internal hemorrhoids were                            found during retroflexion. The hemorrhoids were                            medium-sized. Complications:            No immediate complications. Estimated Blood Loss:     Estimated blood loss was minimal. Impression:               - One 11 mm polyp at the hepatic flexure, removed                            with a cold snare. Resected and retrieved.                           - Non-bleeding external and internal hemorrhoids. Recommendation:           - Patient has a contact number available for                            emergencies. The signs and symptoms of potential                            delayed complications were discussed with the                            patient. Return to normal activities tomorrow.                             Written discharge instructions were provided to the                            patient.                           - Resume previous diet.                           - Continue present medications.                           - Await pathology results.                           - Repeat colonoscopy in 3 - 5 years for                            surveillance based on pathology results. Mauri Pole, MD 01/05/2022 11:22:18 AM This report has been signed electronically.

## 2022-01-05 NOTE — Progress Notes (Signed)
PT taken to PACU. Monitors in place. VSS. Report given to RN. 

## 2022-01-05 NOTE — Progress Notes (Signed)
Pt's states no medical or surgical changes since previsit or office visit. 

## 2022-01-05 NOTE — Progress Notes (Signed)
Called to room to assist during endoscopic procedure.  Patient ID and intended procedure confirmed with present staff. Received instructions for my participation in the procedure from the performing physician.  

## 2022-01-05 NOTE — Progress Notes (Signed)
[facility] History and Physical   Primary Care Physician:  Hoyt Koch, MD   Reason for Procedure:  History of adenomatous colon polyps  Plan:    Surveillance colonoscopy with possible interventions as needed     HPI: [name] is a very pleasant 56 y.o. female here for surveillance colonoscopy. Denies any nausea, vomiting, abdominal pain, melena or bright red blood per rectum  The risks and benefits as well as alternatives of endoscopic procedure(s) have been discussed and reviewed. All questions answered. The patient agrees to proceed.    Past Medical History:  Diagnosis Date   Anxiety    Cancer ([place])    THYROID CA   Depression    H/O total thyroidectomy 1995   for thyoid cancer   History of adenomatous polyp of colon    tubular adenoma's 01-14-2011;  02-04-2016   History of eating disorder    depression   History of ovarian cyst    12-21-2013  at [facility]  lap. w/ lso and right salpingecotmy-- per path left ovary cystic teratoma and bilateral fallopian paratubal cyst's   History of radiation to head and neck region 1995   thryoid cancer--- pt states no throat/ swallowing issues   History of supraventricular tachycardia since age 67- had not seen a cardiologist until adulthood   sustained SVT since age 88-- has had ED visit's w/ adenosine and cardioversion in past/  05-10-2013  RFCA of AVNRT ablation by dr gregg taylor/  12-20-2016 per pt no not any issues, S&S since ablation   History of thyroid cancer dx 1995--- 12-20-2016 per pt no recurrence and was release from oncologist   s/p  total thyroidectomy and radiation completed 1996   Hypothyroidism, postsurgical 1995   Major depressive disorder    Sleep apnea    H/O,NO LONGER HAVE UPDATED 12/08/21    Past Surgical History:  Procedure Laterality Date   APPENDECTOMY  2004   [place] and 1996   Bilateral Tubal Ligation w/ last c/s [place] N/A 12/24/2016   Procedure: [place];  Surgeon: [name], MD;  Location: [facility];  Service: Gynecology;  Laterality: N/A;  requesting 7:30am OR time  requests one hour   GASTRIC BY PASS     HYSTEROSCOPY WITH NOVASURE N/A 12/24/2016   Procedure: HYSTEROSCOPY WITH NOVASURE;  Surgeon: [name], MD;  Location: [place] [facility];  Service: Gynecology;  Laterality: N/A;   KNEE ARTHROSCOPY W/ ACL RECONSTRUCTION Bilateral 04/ 2015;  2012   LAPAROSCOPIC SALPINGOOPHERECTOMY  12-21-2013  dr Fermin Schwab at [facility] and right salpingectomy   LIPOMA EXCISION  7829'F ;  10-13-2004 dr Excell Seltzer at [facility]   right neck   POLYPECTOMY     SUPRAVENTRICULAR TACHYCARDIA ABLATION N/A 05/10/2013   Procedure: [place];  Surgeon: Evans Lance, MD;  Location: [facility] CATH LAB;  Service: Cardiovascular;  Laterality: N/A;   TOTAL THYROIDECTOMY Bilateral 1995    Prior to Admission medications   Medication Sig Start Date End Date Taking? Authorizing Provider  amphetamine-dextroamphetamine (ADDERALL) 10 MG tablet Take 10 mg by mouth 2 (two) times daily with a meal.  06/07/13  Yes [provider]  buPROPion (WELLBUTRIN XL) 300 MG 24 hr tablet Take 300 mg by mouth every morning.    Yes [provider]  levothyroxine (SYNTHROID) 150 MCG tablet Take 1 tablet (150 mcg total) by  mouth daily. 10/29/21  Yes Hoyt Koch, MD  nitrofurantoin, macrocrystal-monohydrate, (MACROBID) 100 MG capsule Take 1 pill twice a day for 7 days then as needed 10/29/21  Yes Hoyt Koch, MD  venlafaxine XR (EFFEXOR-XR) 75 MG 24 hr capsule Take 75 mg by mouth daily with breakfast.  06/24/16  Yes [provider]  cephALEXin (KEFLEX) 500 MG capsule Take 1 capsule (500 mg total) by mouth 2 (two) times daily for 5 days. 12/31/21 01/05/22  Hoyt Koch, MD   lithium carbonate (ESKALITH) 450 MG CR tablet Take 450 mg by mouth in the morning and at bedtime. 11/03/20   [provider]  Multiple Vitamin (MULTIVITAMIN ADULT PO) Take by mouth daily.    [provider]  valACYclovir (VALTREX) 1000 MG tablet Take 1 tablet (1,000 mg total) by mouth every 12 (twelve) hours as needed (for onset of cold sores). Patient not taking: Reported on 12/08/2021 10/29/21   Hoyt Koch, MD    Current Outpatient Medications  Medication Sig Dispense Refill   amphetamine-dextroamphetamine (ADDERALL) 10 MG tablet Take 10 mg by mouth 2 (two) times daily with a meal.      buPROPion (WELLBUTRIN XL) 300 MG 24 hr tablet Take 300 mg by mouth every morning.      levothyroxine (SYNTHROID) 150 MCG tablet Take 1 tablet (150 mcg total) by mouth daily. 90 tablet 3   nitrofurantoin, macrocrystal-monohydrate, (MACROBID) 100 MG capsule Take 1 pill twice a day for 7 days then as needed 45 capsule 2   venlafaxine XR (EFFEXOR-XR) 75 MG 24 hr capsule Take 75 mg by mouth daily with breakfast.   6   cephALEXin (KEFLEX) 500 MG capsule Take 1 capsule (500 mg total) by mouth 2 (two) times daily for 5 days. 10 capsule 0   lithium carbonate (ESKALITH) 450 MG CR tablet Take 450 mg by mouth in the morning and at bedtime.     Multiple Vitamin (MULTIVITAMIN ADULT PO) Take by mouth daily.     valACYclovir (VALTREX) 1000 MG tablet Take 1 tablet (1,000 mg total) by mouth every 12 (twelve) hours as needed (for onset of cold sores). (Patient not taking: Reported on 12/08/2021) 180 tablet 3   Current Facility-Administered Medications  Medication Dose Route Frequency Provider Last Rate Last Admin   0.9 %  sodium chloride infusion  500 mL Intravenous Continuous Maui Ahart, Venia Minks, MD        Allergies as of 01/05/2022 - Review Complete 01/05/2022  Allergen Reaction Noted   Codeine Itching 01/21/2016    Family History  Problem Relation Age of Onset   Alzheimer's disease Mother         currently 17.   Colon cancer Father 53       currently 58.   Lung cancer Father    Colon polyps Sister    Alzheimer's disease Maternal Grandmother    Colon cancer Paternal Grandmother    Colon cancer Paternal Grandfather    Stomach cancer Neg Hx    Crohn's disease Neg Hx    Esophageal cancer Neg Hx    Rectal cancer Neg Hx    Ulcerative colitis Neg Hx     Social History   Socioeconomic History   Marital status: Married    Spouse name: Not on file   Number of children: 2   Years of education: Not on file   Highest education level: Not on file  Occupational History   Not on file  Tobacco Use   Smoking  status: Never    Passive exposure: Past (BOTH PARENTS SMOKED)   Smokeless tobacco: Never   Tobacco comments:    Married, lives with spouse. Works as Aeronautical engineer Use: Never used  Substance and Sexual Activity   Alcohol use: Yes    Comment: seldom   Drug use: No   Sexual activity: Yes  Other Topics Concern   Not on file  Social History Narrative   Lives in [place] with husband and son.  Chemical engineer.   Social Determinants of Health   Financial Resource Strain: Not on file  Food Insecurity: Not on file  Transportation Needs: Not on file  Physical Activity: Not on file  Stress: Not on file  Social Connections: Not on file  Intimate Partner Violence: Not on file    Review of Systems:  All other review of systems negative except as mentioned in the HPI.  Physical Exam: Vital signs in last 24 hours: BP (!) 138/105   Pulse 71   Temp 98 F (36.7 C) (Temporal)   Ht '5\' 2"'$  (1.575 m)   Wt 170 lb (77.1 kg)   LMP 09/08/2017   SpO2 100%   BMI 31.09 kg/m  General:   Alert, NAD Lungs:  Clear .   Heart:  Regular rate and rhythm Abdomen:  Soft, nontender and nondistended. Neuro/Psych:  Alert and cooperative. Normal mood and affect. A and O x 3  Reviewed labs, radiology imaging, old records and pertinent past GI work up  Patient is  appropriate for planned procedure(s) and anesthesia in an ambulatory setting   K. Denzil Magnuson , MD [phone]

## 2022-01-06 ENCOUNTER — Telehealth: Payer: Self-pay

## 2022-01-06 ENCOUNTER — Other Ambulatory Visit (HOSPITAL_COMMUNITY): Payer: Self-pay | Admitting: Psychiatry

## 2022-01-06 NOTE — Telephone Encounter (Signed)
Attempted to reach patient for post-procedure f/u call. No answer. Left message for her to please not hesitate to call us if she has any questions/concerns regarding her care. 

## 2022-01-08 ENCOUNTER — Encounter: Payer: Self-pay | Admitting: Gastroenterology

## 2022-02-09 ENCOUNTER — Encounter: Payer: Self-pay | Admitting: Internal Medicine

## 2022-02-09 DIAGNOSIS — L03012 Cellulitis of left finger: Secondary | ICD-10-CM

## 2022-02-09 DIAGNOSIS — M79645 Pain in left finger(s): Secondary | ICD-10-CM

## 2022-02-09 MED ORDER — CEPHALEXIN 500 MG PO CAPS: 500.0000 mg | ORAL_CAPSULE | Freq: Two times a day (BID) | ORAL | 0 refills | Status: AC

## 2022-02-09 NOTE — Telephone Encounter (Signed)
Spoke to pt--left middle finger have yellow pus, redness, and drainage. Denied fever. Please advise

## 2022-11-09 ENCOUNTER — Encounter: Payer: 59 | Admitting: Internal Medicine

## 2022-11-15 ENCOUNTER — Encounter: Payer: Self-pay | Admitting: Internal Medicine

## 2022-11-15 ENCOUNTER — Ambulatory Visit (INDEPENDENT_AMBULATORY_CARE_PROVIDER_SITE_OTHER): Payer: Commercial Managed Care - PPO | Admitting: Internal Medicine

## 2022-11-15 VITALS — BP 122/82 | HR 72 | Temp 98.2°F | Ht 62.0 in | Wt 175.0 lb

## 2022-11-15 DIAGNOSIS — Z1231 Encounter for screening mammogram for malignant neoplasm of breast: Secondary | ICD-10-CM

## 2022-11-15 DIAGNOSIS — B001 Herpesviral vesicular dermatitis: Secondary | ICD-10-CM

## 2022-11-15 DIAGNOSIS — Z0000 Encounter for general adult medical examination without abnormal findings: Secondary | ICD-10-CM

## 2022-11-15 DIAGNOSIS — F32A Depression, unspecified: Secondary | ICD-10-CM | POA: Diagnosis not present

## 2022-11-15 DIAGNOSIS — E039 Hypothyroidism, unspecified: Secondary | ICD-10-CM

## 2022-11-15 DIAGNOSIS — Z9889 Other specified postprocedural states: Secondary | ICD-10-CM

## 2022-11-15 DIAGNOSIS — G4733 Obstructive sleep apnea (adult) (pediatric): Secondary | ICD-10-CM

## 2022-11-15 LAB — LIPID PANEL
Cholesterol: 162 mg/dL (ref 0–200)
HDL: 61.2 mg/dL
LDL Cholesterol: 80 mg/dL (ref 0–99)
NonHDL: 100.36
Total CHOL/HDL Ratio: 3
Triglycerides: 103 mg/dL (ref 0.0–149.0)
VLDL: 20.6 mg/dL (ref 0.0–40.0)

## 2022-11-15 LAB — COMPREHENSIVE METABOLIC PANEL
ALT: 16 U/L (ref 0–35)
AST: 19 U/L (ref 0–37)
Albumin: 3.3 g/dL — ABNORMAL LOW (ref 3.5–5.2)
Alkaline Phosphatase: 90 U/L (ref 39–117)
BUN: 11 mg/dL (ref 6–23)
CO2: 25 mEq/L (ref 19–32)
Calcium: 9.2 mg/dL (ref 8.4–10.5)
Chloride: 106 mEq/L (ref 96–112)
Creatinine, Ser: 0.72 mg/dL (ref 0.40–1.20)
GFR: 91.69 mL/min (ref >60.00)
Glucose, Bld: 95 mg/dL (ref 70–99)
Potassium: 4 mEq/L (ref 3.5–5.1)
Sodium: 141 mEq/L (ref 135–145)
Total Bilirubin: 0.5 mg/dL (ref 0.2–1.2)
Total Protein: 6.9 g/dL (ref 6.0–8.3)

## 2022-11-15 LAB — T4, FREE: Free T4: 1.52 ng/dL (ref 0.60–1.60)

## 2022-11-15 LAB — TSH: TSH: 0.03 u[IU]/mL — ABNORMAL LOW (ref 0.35–5.50)

## 2022-11-15 LAB — CBC
HCT: 43.5 % (ref 36.0–46.0)
Hemoglobin: 14.2 g/dL (ref 12.0–15.0)
MCHC: 32.5 g/dL (ref 30.0–36.0)
MCV: 92.9 fl (ref 78.0–100.0)
Platelets: 224 10*3/uL (ref 150.0–400.0)
RBC: 4.68 Mil/uL (ref 3.87–5.11)
RDW: 13.5 % (ref 11.5–15.5)
WBC: 4.5 10*3/uL (ref 4.0–10.5)

## 2022-11-15 MED ORDER — LEVOTHYROXINE SODIUM 150 MCG PO TABS: 150.0000 ug | ORAL_TABLET | Freq: Every day | ORAL | 3 refills | Status: DC

## 2022-11-15 MED ORDER — VALACYCLOVIR HCL 1 G PO TABS: 1000.0000 mg | ORAL_TABLET | Freq: Two times a day (BID) | ORAL | 3 refills | Status: DC | PRN

## 2022-11-15 MED ORDER — NITROFURANTOIN MONOHYD MACRO 100 MG PO CAPS: ORAL_CAPSULE | ORAL | 2 refills | Status: DC

## 2022-11-15 NOTE — Patient Instructions (Signed)
Get the mammogram done they will call or you can schedule on mychart.

## 2022-11-15 NOTE — Progress Notes (Signed)
   Subjective:   Patient ID: [name], female    DOB: 02-01-1966, 57 y.o.   MRN: [id number]  HPI The patient is here for physical.  PMH, [facility], social history reviewed and updated  Review of Systems  Objective:  Physical Exam  Vitals:   11/15/22 1347  BP: 122/82  Pulse: 72  Temp: 98.2 F (36.8 C)  TempSrc: Oral  SpO2: 98%  Weight: 175 lb (79.4 kg)  Height: 5\' 2"  (1.575 m)    Assessment & Plan:

## 2022-11-16 LAB — LITHIUM LEVEL: Lithium Lvl: <0.3 mmol/L — ABNORMAL LOW (ref 0.6–1.2)

## 2022-11-17 NOTE — Assessment & Plan Note (Signed)
Takes valtrex as needed refilled today. No current outbreak.

## 2022-11-17 NOTE — Assessment & Plan Note (Signed)
Still having issues with her knees intermittently. If worsening will refer to sports medicine

## 2022-11-17 NOTE — Assessment & Plan Note (Signed)
Still using CPAP regularly and getting benefits from this.

## 2022-11-17 NOTE — Assessment & Plan Note (Signed)
Seeing mental health and stable on wellbutrin, adderall, effexor. Lithium on list and she still needs to talk with her provider but has stopped this. Checking CMP, CBC, lithium level, lipid panel for monitoring of side effects.

## 2022-11-17 NOTE — Assessment & Plan Note (Signed)
Flu shot yearly. Shingrix complete. Tetanus up to date. Colonoscopy up to date. Mammogram ordered, pap smear overdue. Counseled about sun safety and mole surveillance. Counseled about the dangers of distracted driving. Given 10 year screening recommendations.

## 2022-11-17 NOTE — Assessment & Plan Note (Signed)
Checking TSH and free T4. She has been off lithium for some time now on her own and may need adjustment to levothyroxine 150 mcg daily.

## 2023-10-26 ENCOUNTER — Other Ambulatory Visit: Payer: Self-pay | Admitting: Internal Medicine

## 2024-01-17 ENCOUNTER — Ambulatory Visit (HOSPITAL_COMMUNITY)
Admission: EM | Admit: 2024-01-17 | Discharge: 2024-01-17 | Disposition: A | Discharge status: Home / Self Care | Attending: Student | Admitting: Student

## 2024-01-17 ENCOUNTER — Encounter (HOSPITAL_COMMUNITY): Payer: Self-pay | Admitting: *Deleted

## 2024-01-17 ENCOUNTER — Other Ambulatory Visit: Payer: Self-pay

## 2024-01-17 ENCOUNTER — Ambulatory Visit: Payer: Self-pay

## 2024-01-17 DIAGNOSIS — M940 Chondrocostal junction syndrome [Tietze]: Secondary | ICD-10-CM | POA: Diagnosis not present

## 2024-01-17 MED ORDER — KETOROLAC TROMETHAMINE 30 MG/ML IJ SOLN
INTRAMUSCULAR | Status: AC
  Filled 2024-01-17: qty 1

## 2024-01-17 MED ORDER — KETOROLAC TROMETHAMINE 30 MG/ML IJ SOLN
30.0000 mg | Freq: Once | INTRAMUSCULAR | Status: AC
  Administered 2024-01-17: 30 mg via INTRAMUSCULAR

## 2024-01-17 NOTE — ED Provider Notes (Signed)
 [facility]    CSN: [id number] Arrival date & time: 01/17/24  1026      History   Chief Complaint Chief Complaint  Patient presents with   Chest Pain    HPI [name] is a 58 y.o. female presenting with R sided chest pain x4 days, feels like she was slammed' with something. Precipitating factor may have been heavy lifting she did while grocery shopping several days ago. Worse with movement. She has attempted tylenol  at home, without improvement.  Due to history of gastric bypass, she is unable to take NSAID.  She denies left-sided chest pain, denies radiation of the pain, denies headaches, denies dizziness.  She does have a cardiac history of SVT, is status post ablation in 2018.  HPI  Past Medical History:  Diagnosis Date   Anxiety    Cancer (HCC)    THYROID  CA   Depression    H/O total thyroidectomy 1995   for thyoid cancer   History of adenomatous polyp of colon    tubular adenoma's 01-14-2011;  02-04-2016   History of eating disorder    depression   History of ovarian cyst    12-21-2013  at [facility]  lap. w/ lso and right salpingecotmy-- per path left ovary cystic teratoma and bilateral fallopian paratubal cyst's   History of radiation to head and neck region 1995   thryoid cancer--- pt states no throat/ swallowing issues   History of supraventricular tachycardia since age 73- had not seen a cardiologist until adulthood   sustained SVT since age 80-- has had ED visit's w/ adenosine  and cardioversion in past/  05-10-2013  RFCA of AVNRT ablation by dr gregg taylor/  12-20-2016 per pt no not any issues, S&S since ablation   History of thyroid  cancer dx 1995--- 12-20-2016 per pt no recurrence and was release from oncologist   s/p  total thyroidectomy and radiation completed 1996   Hypothyroidism, postsurgical 1995   Major depressive disorder    Sleep apnea    H/O,NO LONGER HAVE UPDATED 12/08/21    Patient Active Problem List   Diagnosis Date Noted    Finger pain 12/31/2021   Obstructive sleep apnea on CPAP 08/15/2019   Cold sore 12/28/2018   Skin tag 12/28/2018   Routine general medical examination at a health care facility 04/24/2015   Ovarian teratoma 02/20/2014   Right shoulder pain 01/18/2014   S/P ACL reconstruction 08/16/2013   Hypothyroidism 12/15/2009   Depression 12/15/2009   SUPRAVENTRICULAR TACHYCARDIA, HX OF 12/15/2009   EATING DISORDER, HX OF 12/15/2009    Past Surgical History:  Procedure Laterality Date   APPENDECTOMY  2004   CESAREAN SECTION  1986 and 1996   Bilateral Tubal Ligation w/ last c/s 1996   COLONOSCOPY     DILATATION & CURETTAGE/HYSTEROSCOPY WITH MYOSURE N/A 12/24/2016   Procedure: DILATATION & CURETTAGE/HYSTEROSCOPY WITH MYOSURE;  Surgeon: Lavoie, Marie-Lyne, MD;  Location: [facility];  Service: Gynecology;  Laterality: N/A;  requesting 7:30am OR time  requests one hour   GASTRIC BY PASS     HYSTEROSCOPY WITH NOVASURE N/A 12/24/2016   Procedure: HYSTEROSCOPY WITH NOVASURE;  Surgeon: Lavoie, Marie-Lyne, MD;  Location: [facility] ;  Service: Gynecology;  Laterality: N/A;   KNEE ARTHROSCOPY W/ ACL RECONSTRUCTION Bilateral 04/ 2015;  2012   LAPAROSCOPIC SALPINGOOPHERECTOMY  12-21-2013  dr devonna at [facility] and right salpingectomy   LIPOMA EXCISION  1990's ;  10-13-2004 dr mikell at [facility]  right neck   POLYPECTOMY     SUPRAVENTRICULAR TACHYCARDIA ABLATION N/A 05/10/2013   Procedure: SUPRAVENTRICULAR TACHYCARDIA ABLATION;  Surgeon: Danelle LELON Birmingham, MD;  Location: [facility] CATH LAB;  Service: Cardiovascular;  Laterality: N/A;   TOTAL THYROIDECTOMY Bilateral 1995    OB History   No obstetric history on file.      Home Medications    Prior to Admission medications   Medication Sig Start Date End Date Taking? Authorizing Provider  amphetamine-dextroamphetamine (ADDERALL) 10 MG tablet Take 10 mg by mouth 2 (two) times daily with a meal.  06/07/13  Yes  [provider]  buPROPion (WELLBUTRIN XL) 300 MG 24 hr tablet Take 300 mg by mouth every morning.    Yes [provider]  levothyroxine  (SYNTHROID ) 150 MCG tablet Take 1 tablet by mouth daily. 11/01/23  Yes Rollene Almarie LABOR, MD  Multiple Vitamin (MULTIVITAMIN ADULT PO) Take by mouth daily.   Yes [provider]  venlafaxine  XR (EFFEXOR -XR) 75 MG 24 hr capsule Take 75 mg by mouth daily with breakfast.  06/24/16  Yes [provider]    Family History Family History  Problem Relation Age of Onset   Alzheimer's disease Mother        currently 26.   Colon cancer Father 69       currently 52.   Lung cancer Father    Colon polyps Sister    Alzheimer's disease Maternal Grandmother    Colon cancer Paternal Grandmother    Colon cancer Paternal Grandfather    Stomach cancer Neg Hx    Crohn's disease Neg Hx    Esophageal cancer Neg Hx    Rectal cancer Neg Hx    Ulcerative colitis Neg Hx     Social History Social History   Tobacco Use   Smoking status: Never    Passive exposure: Past (BOTH PARENTS SMOKED)   Smokeless tobacco: Never   Tobacco comments:    Married, lives with spouse. 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  Substance Use Topics   Alcohol use: Yes    Comment: seldom   Drug use: No     Allergies   Codeine   Review of Systems Review of Systems  Musculoskeletal:        Chest wall pain      Physical Exam Triage Vital Signs ED Triage Vitals [01/17/24 1037]  Encounter Vitals Group     BP      Girls Systolic BP Percentile      Girls Diastolic BP Percentile      Boys Systolic BP Percentile      Boys Diastolic BP Percentile      Pulse      Resp      Temp      Temp src      SpO2      Weight      Height      Head Circumference      Peak Flow      Pain Score 7     Pain Loc      Pain Education      Exclude from Growth Chart    No data found.  Updated Vital Signs BP (!) 147/83   Pulse 72    Temp 98.3 F (36.8 C)   Resp 18   LMP 09/08/2017   SpO2 94%   Visual Acuity Right Eye Distance:   Left Eye Distance:   Bilateral Distance:  Right Eye Near:   Left Eye Near:    Bilateral Near:     Physical Exam Vitals reviewed.  Constitutional:      General: She is not in acute distress.    Appearance: Normal appearance. She is not ill-appearing or diaphoretic.  HENT:     Head: Normocephalic and atraumatic.  Cardiovascular:     Rate and Rhythm: Normal rate and regular rhythm.     Heart sounds: Normal heart sounds.  Pulmonary:     Effort: Pulmonary effort is normal.     Breath sounds: Normal breath sounds.  Musculoskeletal:     Comments: R anterior chest wall pain reproduced with palpation. Pain also reproduced when the patient twists side-to-side and raises her R arm.  Skin:    General: Skin is warm.  Neurological:     General: No focal deficit present.     Mental Status: She is alert and oriented to person, place, and time.  Psychiatric:        Mood and Affect: Mood normal.        Behavior: Behavior normal.        Thought Content: Thought content normal.        Judgment: Judgment normal.      UC Treatments / Results  Labs (all labs ordered are listed, but only abnormal results are displayed) Labs Reviewed - No data to display  EKG   Radiology No results found.  Procedures Procedures (including critical care time)  Medications Ordered in UC Medications  ketorolac  (TORADOL ) 30 MG/ML injection 30 mg (30 mg Intramuscular Given 01/17/24 1109)    Initial Impression / Assessment and Plan / UC Course  I have reviewed the triage vital signs and the nursing notes.  Pertinent labs & imaging results that were available during my care of the patient were reviewed by me and considered in my medical decision making (see chart for details).     Costochondritis Patient presenting with 4 days of right sided costochondritis.  Pain is reproducible.  EKG is NSR,  unchanged from 2022 EKG.  She is unable to tolerate NSAIDs due to history of gastric bypass, so we will administer Toradol  IM today.  Encouraged her to continue over-the-counter interventions like Tylenol , lidocaine  patch, rest, heat.  ED return precautions discussed.  Final Clinical Impressions(s) / UC Diagnoses   Final diagnoses:  Costochondritis     Discharge Instructions      - The pain on the right side of your chest is most likely costochondritis.  This is an inflammation of the cartilage and muscle in the chest. -While I cannot totally rule out cardiac pathology in urgent care, your EKG looks normal, and is unchanged compared with your last EKG. -Continue over-the-counter interventions like lidocaine  patch, heating pad, tylenol . -If you develop new symptoms, including left-sided chest pain, present to the emergency department.     ED Prescriptions   None    PDMP not reviewed this encounter.   Arlyss Leita BRAVO, PA-C 01/17/24 1205

## 2024-01-17 NOTE — Telephone Encounter (Signed)
  FYI Only or Action Required?: Action required by provider: update on patient condition.  Patient was last seen in primary care on 11/15/2022 by Rollene Almarie LABOR, MD.  Called Nurse Triage reporting Chest Pain.  Symptoms began several days ago.  Interventions attempted: Rest, hydration, or home remedies.  Symptoms are: gradually worsening.  Triage Disposition: Call EMS 911 Now  Patient/caregiver understands and will follow disposition?: No, refuses disposition  Copied from CRM #[id number]. Topic: Clinical - Red Word Triage >> Jan 17, 2024  9:16 AM Charlet HERO wrote: Red Word that prompted transfer to Nurse Triage: Pain in the right side of chest for 3 days , Almarie Rollene  [facility] Reason for Disposition  [1] Chest pain lasts > 5 minutes AND [2] age > 13  Answer Assessment - Initial Assessment Questions Additional info: Emergent evaluation advised, patient states she will go but is not waiting for 3 hours, she is requesting to schedule an appointment with pcp tomorrow for follow up and will attempt to seek evaluation today. Patient states pain is not near my heart, in fact it's the opposite side. Advised patient to proceed for evaluation and call back for follow up appointment, she inisists on scheduling while on the call, this writer called to [facility] and discussed with Rocky who advised this Clinical research associate can schedule appointment and for patient to be aware she may be deferred to hospital via ambulance. Becky verbalized understanding and assures this Clinical research associate she will attempt at least urgent care today.    1. LOCATION: Where does it hurt?       Right side chest-no injury 2. RADIATION: Does the pain go anywhere else? (e.g., into neck, jaw, arms, back)     denies 3. ONSET: When did the chest pain begin? (Minutes, hours or days)      3 days 4. PATTERN: Does the pain come and go, or has it been constant since it started?  Does it get worse with exertion?      constant 5.  DURATION: How long does it last (e.g., seconds, minutes, hours)      3 days, worse with movement 6. SEVERITY: How bad is the pain?  (e.g., Scale 1-10; mild, moderate, or severe)     Worse with movement and deep breath 7. CARDIAC RISK FACTORS: Do you have any history of heart problems or risk factors for heart disease? (e.g., angina, prior heart attack; diabetes, high blood pressure, high cholesterol, smoker, or strong family history of heart disease)     Denies  8. PULMONARY RISK FACTORS: Do you have any history of lung disease?  (e.g., blood clots in lung, asthma, emphysema, birth control pills)     denies 9. CAUSE: What do you think is causing the chest pain?     unsure 10. OTHER SYMPTOMS: Do you have any other symptoms? (e.g., dizziness, nausea, vomiting, sweating, fever, difficulty breathing, cough)       denies  Protocols used: Chest Pain-A-AH

## 2024-01-17 NOTE — Discharge Instructions (Addendum)
-   The pain on the right side of your chest is most likely costochondritis.  This is an inflammation of the cartilage and muscle in the chest. -While I cannot totally rule out cardiac pathology in urgent care, your EKG looks normal, and is unchanged compared with your last EKG. -Continue over-the-counter interventions like lidocaine  patch, heating pad, tylenol . -If you develop new symptoms, including left-sided chest pain, present to the emergency department.

## 2024-01-17 NOTE — ED Triage Notes (Signed)
 PT reports she has RT sided CP for 4 day s. Pt denies any other Sx's  Pt called her PCP and was advised to go to ED for eval.  CP 7/10 pain score.

## 2024-01-18 ENCOUNTER — Ambulatory Visit (INDEPENDENT_AMBULATORY_CARE_PROVIDER_SITE_OTHER): Admitting: Internal Medicine

## 2024-01-18 ENCOUNTER — Encounter: Payer: Self-pay | Admitting: Internal Medicine

## 2024-01-18 VITALS — BP 124/82 | HR 77 | Temp 97.9°F | Ht 62.0 in | Wt 158.0 lb

## 2024-01-18 DIAGNOSIS — Z23 Encounter for immunization: Secondary | ICD-10-CM | POA: Diagnosis not present

## 2024-01-18 DIAGNOSIS — M940 Chondrocostal junction syndrome [Tietze]: Secondary | ICD-10-CM

## 2024-01-18 DIAGNOSIS — B001 Herpesviral vesicular dermatitis: Secondary | ICD-10-CM

## 2024-01-18 DIAGNOSIS — F3342 Major depressive disorder, recurrent, in full remission: Secondary | ICD-10-CM

## 2024-01-18 DIAGNOSIS — E039 Hypothyroidism, unspecified: Secondary | ICD-10-CM | POA: Diagnosis not present

## 2024-01-18 DIAGNOSIS — Z0000 Encounter for general adult medical examination without abnormal findings: Secondary | ICD-10-CM | POA: Diagnosis not present

## 2024-01-18 LAB — LIPID PANEL
Cholesterol: 171 mg/dL (ref 0–200)
HDL: 72.6 mg/dL (ref >39.00)
LDL Cholesterol: 84 mg/dL (ref 0–99)
NonHDL: 98.52
Total CHOL/HDL Ratio: 2
Triglycerides: 72 mg/dL (ref 0.0–149.0)
VLDL: 14.4 mg/dL (ref 0.0–40.0)

## 2024-01-18 LAB — COMPREHENSIVE METABOLIC PANEL WITH GFR
ALT: 11 U/L (ref 0–35)
AST: 18 U/L (ref 0–37)
Albumin: 4.2 g/dL (ref 3.5–5.2)
Alkaline Phosphatase: 73 U/L (ref 39–117)
BUN: 19 mg/dL (ref 6–23)
CO2: 31 meq/L (ref 19–32)
Calcium: 9.4 mg/dL (ref 8.4–10.5)
Chloride: 103 meq/L (ref 96–112)
Creatinine, Ser: 0.72 mg/dL (ref 0.40–1.20)
GFR: 90.93 mL/min (ref >60.00)
Glucose, Bld: 90 mg/dL (ref 70–99)
Potassium: 4.5 meq/L (ref 3.5–5.1)
Sodium: 140 meq/L (ref 135–145)
Total Bilirubin: 0.6 mg/dL (ref 0.2–1.2)
Total Protein: 6.8 g/dL (ref 6.0–8.3)

## 2024-01-18 LAB — CBC
HCT: 43.4 % (ref 36.0–46.0)
Hemoglobin: 14.2 g/dL (ref 12.0–15.0)
MCHC: 32.7 g/dL (ref 30.0–36.0)
MCV: 93.2 fl (ref 78.0–100.0)
Platelets: 237 K/uL (ref 150.0–400.0)
RBC: 4.66 Mil/uL (ref 3.87–5.11)
RDW: 13.6 % (ref 11.5–15.5)
WBC: 5.2 K/uL (ref 4.0–10.5)

## 2024-01-18 LAB — T4, FREE: Free T4: 1.3 ng/dL (ref 0.60–1.60)

## 2024-01-18 LAB — TSH: TSH: 0.03 u[IU]/mL — ABNORMAL LOW (ref 0.35–5.50)

## 2024-01-18 MED ORDER — CYCLOBENZAPRINE HCL 5 MG PO TABS: 5.0000 mg | ORAL_TABLET | Freq: Three times a day (TID) | ORAL | 1 refills | Status: AC | PRN

## 2024-01-18 MED ORDER — LEVOTHYROXINE SODIUM 150 MCG PO TABS: 150.0000 ug | ORAL_TABLET | Freq: Every day | ORAL | 3 refills | Status: AC

## 2024-01-18 MED ORDER — PREDNISONE 20 MG PO TABS: 40.0000 mg | ORAL_TABLET | Freq: Every day | ORAL | 0 refills | Status: AC

## 2024-01-18 MED ORDER — NITROFURANTOIN MONOHYD MACRO 100 MG PO CAPS: ORAL_CAPSULE | ORAL | 2 refills | Status: AC

## 2024-01-18 MED ORDER — VALACYCLOVIR HCL 1 G PO TABS: 1000.0000 mg | ORAL_TABLET | Freq: Two times a day (BID) | ORAL | 3 refills | Status: AC | PRN

## 2024-01-18 NOTE — Progress Notes (Signed)
   Subjective:   Patient ID: [name], female    DOB: 1965/12/23, 58 y.o.   MRN: [id number]  The patient is here for physical. Pertinent topics discussed: Discussed the use of AI scribe software for clinical note transcription with the patient, who gave verbal consent to proceed.  History of Present Illness [name] is a 58 year old female who presents with chest pain following heavy lifting.  She began experiencing chest pain on Saturday night after lifting heavy cases of alcohol while preparing for a wedding. The pain has progressively worsened and is constant, exacerbated by sneezing and deep breathing. She describes the pain as severe, making it difficult to perform daily activities such as getting out of bed. The pain is localized to the chest and back, as described by the patient.  She has a history of bariatric surgery, which limits her ability to take NSAIDs like Advil . She received a Toradol  injection at urgent care, which provided temporary relief. Tylenol  has been ineffective in managing her pain. No breathing problems aside from pain with deep breaths and no issues with being winded or short of breath.  She underwent elective skin removal surgery a few months ago, which involved a long incision around her middle. She reports a lengthy and painful recovery process but is satisfied with the results.  She is currently taking Synthroid , an antibiotic for UTI prevention, and Valtrex . She has not experienced any new major stomach issues, diarrhea, constipation, or heartburn.  PMH, [facility], social history reviewed and updated  Review of Systems  Constitutional: Negative.   HENT: Negative.    Eyes: Negative.   Respiratory:  Negative for cough, chest tightness and shortness of breath.   Cardiovascular:  Positive for chest pain. Negative for palpitations and leg swelling.  Gastrointestinal:  Negative for abdominal distention, abdominal pain, constipation,  diarrhea, nausea and vomiting.  Musculoskeletal: Negative.   Skin: Negative.   Neurological: Negative.   Psychiatric/Behavioral: Negative.      Objective:  Physical Exam Constitutional:      Appearance: She is well-developed.  HENT:     Head: Normocephalic and atraumatic.  Cardiovascular:     Rate and Rhythm: Normal rate and regular rhythm.  Pulmonary:     Effort: Pulmonary effort is normal. No respiratory distress.     Breath sounds: Normal breath sounds. No wheezing or rales.  Abdominal:     General: Bowel sounds are normal. There is no distension.     Palpations: Abdomen is soft.     Tenderness: There is no abdominal tenderness.  Musculoskeletal:        General: Tenderness present.     Cervical back: Normal range of motion.     Comments: Right chest wall tenderness to palpation and movement  Skin:    General: Skin is warm and dry.  Neurological:     Mental Status: She is alert and oriented to person, place, and time.     Coordination: Coordination normal.     Vitals:   01/18/24 1059  BP: 124/82  Pulse: 77  Temp: 97.9 F (36.6 C)  TempSrc: Oral  SpO2: 97%  Weight: 158 lb (71.7 kg)  Height: 5' 2 (1.575 m)    Assessment & Plan:   Flu and prevnar 20 given at visit

## 2024-01-18 NOTE — Assessment & Plan Note (Addendum)
 Seeing mental health and stable overall. She is in remission on current regimen.

## 2024-01-18 NOTE — Assessment & Plan Note (Signed)
Checking TSH and free T4 and adjust synthroid as needed.

## 2024-01-18 NOTE — Assessment & Plan Note (Signed)
 Flu shot given. Pneumonia 20 given. Shingrix  complete. Tetanus up to date. Colonoscopy up to date. Mammogram up to date, pap smear up to date. Counseled about sun safety and mole surveillance. Counseled about the dangers of distracted driving. Given 10 year screening recommendations.

## 2024-01-18 NOTE — Patient Instructions (Signed)
 We have sent in prednisone  to take 2 pills daily for 4 days.  We have sent in flexeril  which is a muscle relaxer to take as needed up to 3 times a day for 1-2 weeks.

## 2024-01-18 NOTE — Telephone Encounter (Signed)
FYI... Pt is coming in today to be seen

## 2024-01-18 NOTE — Assessment & Plan Note (Signed)
 Refill valtrex  which she uses.

## 2024-01-18 NOTE — Assessment & Plan Note (Signed)
 Rx prednisone  and flexeril  to help with likely pulled muscle after lifting many heavy boxes.

## 2024-01-23 ENCOUNTER — Ambulatory Visit: Payer: Self-pay | Admitting: Internal Medicine
